# Patient Record
Sex: Male | Born: 1990 | Race: Black or African American | Hispanic: No | Marital: Single | Smoking: Never smoker
Health system: Southern US, Community
[De-identification: ages and names within clinical notes are randomized; demographics above are authoritative.]

## PROBLEM LIST (undated history)

## (undated) DIAGNOSIS — A749 Chlamydial infection, unspecified: Secondary | ICD-10-CM

## (undated) HISTORY — PX: WISDOM TOOTH EXTRACTION: SHX21

---
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
## 2011-08-06 ENCOUNTER — Emergency Department (INDEPENDENT_AMBULATORY_CARE_PROVIDER_SITE_OTHER)
Admission: EM | Admit: 2011-08-06 | Discharge: 2011-08-06 | Disposition: A | Discharge status: Home / Self Care | Payer: Self-pay | Source: Home / Self Care | Attending: Emergency Medicine | Admitting: Emergency Medicine

## 2011-08-06 ENCOUNTER — Encounter (HOSPITAL_COMMUNITY): Payer: Self-pay | Admitting: Emergency Medicine

## 2011-08-06 DIAGNOSIS — N342 Other urethritis: Secondary | ICD-10-CM

## 2011-08-06 HISTORY — DX: Chlamydial infection, unspecified: A74.9

## 2011-08-06 MED ORDER — AZITHROMYCIN 250 MG PO TABS
ORAL_TABLET | ORAL | Status: AC
  Filled 2011-08-06: qty 4

## 2011-08-06 MED ORDER — CEFTRIAXONE SODIUM 250 MG IJ SOLR
250.0000 mg | Freq: Once | INTRAMUSCULAR | Status: AC
  Administered 2011-08-06: 250 mg via INTRAMUSCULAR

## 2011-08-06 MED ORDER — AZITHROMYCIN 250 MG PO TABS
1000.0000 mg | ORAL_TABLET | Freq: Once | ORAL | Status: AC
  Administered 2011-08-06: 1000 mg via ORAL

## 2011-08-06 MED ORDER — CEFTRIAXONE SODIUM 250 MG IJ SOLR
INTRAMUSCULAR | Status: AC
  Filled 2011-08-06: qty 250

## 2011-08-06 MED ORDER — LIDOCAINE HCL (PF) 1 % IJ SOLN
INTRAMUSCULAR | Status: AC
  Filled 2011-08-06: qty 5

## 2011-08-06 NOTE — ED Provider Notes (Signed)
History     CSN: [id number]  Arrival date & time 08/06/11  1554   First MD Initiated Contact with Patient 08/06/11 1654      Chief Complaint  Patient presents with  . SEXUALLY TRANSMITTED DISEASE    (Consider location/radiation/quality/duration/timing/severity/associated sxs/prior treatment) HPI Comments: Pt with 2 days of dysuria.   No urgency, frequency, dysuria, oderous urine, hematuria,  genital blisters, penile itching, pain or discharge. No fevers, N/V, abd pain, back pain. No recent abx use. Pt sexually active with 2 male partners who are both asymptomatic to his knowledge. He does not use condoms with either of them. STD's a concern today. Similar sx before when had Chlamydia. No history of gonorrhea  trichmonoas, syphilis, herpes, HIV.   ROS as noted in HPI. All other ROS negative.    Patient is a 21 y.o. male presenting with STD exposure. The history is provided by the patient.  Exposure to STD This is a new problem. The current episode started more than 2 days ago. The problem occurs constantly. The problem has not changed since onset.Pertinent negatives include no abdominal pain. Nothing aggravates the symptoms. Nothing relieves the symptoms. He has tried a cold compress for the symptoms. The treatment provided no relief.    Past Medical History  Diagnosis Date  . Chlamydia     History reviewed. No pertinent past surgical history.  History reviewed. No pertinent family history.  History  Substance Use Topics  . Smoking status: Never Smoker   . Smokeless tobacco: Not on file  . Alcohol Use: No      Review of Systems  Gastrointestinal: Negative for abdominal pain.    Allergies  Review of patient's allergies indicates no known allergies.  Home Medications  No current outpatient prescriptions on file.  BP 129/57  Pulse 60  Temp 97.7 F (36.5 C) (Oral)  Resp 14  SpO2 100%  Physical Exam  Nursing note and vitals reviewed. Constitutional: He is  oriented to person, place, and time. He appears well-developed and well-nourished.  HENT:  Head: Normocephalic and atraumatic.  Eyes: Conjunctivae and EOM are normal.  Neck: Normal range of motion.  Cardiovascular: Regular rhythm.   Pulmonary/Chest: Effort normal. No respiratory distress.  Abdominal: He exhibits no distension. There is no CVA tenderness.  Genitourinary: Testes normal. Circumcised. No penile erythema or penile tenderness. Discharge found.       Scant watery penile d/c. no rash. Pt declined chaperone.   Musculoskeletal: Normal range of motion. He exhibits no edema and no tenderness.  Lymphadenopathy:       Right: No inguinal adenopathy present.       Left: No inguinal adenopathy present.  Neurological: He is alert and oriented to person, place, and time.  Skin: Skin is warm and dry. No rash noted.  Psychiatric: He has a normal mood and affect. His behavior is normal.    ED Course  Procedures (including critical care time)   Labs Reviewed  GC/CHLAMYDIA PROBE AMP, GENITAL   No results found.   1. Urethritis       MDM   H&P most c/w urethritis. Sent off GC/chlamydia, Will  treat empirically now. Giving ceftriaxone 250 mg IM/azithro 1 gm po.Advised pt to refrain from sexual contact until he knows lab results, symptoms resolve, and partner(s) are treated. Pt provided working phone number. Pt agrees.     Luiz Blare, MD 08/07/11 [phone]

## 2011-08-06 NOTE — ED Notes (Signed)
Patient reports burning sensation for 2-3 days.  Feels this sensation with urination.  Denies discharge, denies sores/blisters.  History of std.

## 2011-08-06 NOTE — ED Notes (Signed)
Given graham crackers and peanut butter to patient, drinking ice water .  Patient aware of delay before being discharged after antibiotic injection

## 2011-08-10 ENCOUNTER — Telehealth (HOSPITAL_COMMUNITY): Payer: Self-pay | Admitting: *Deleted

## 2011-08-10 NOTE — ED Notes (Addendum)
GC neg., Chlamydia pos.  Pt. adequately treated with Zithromax. I called pt. Pt. verified x 2 and given results.  Pt. told he was adequately treated.  Pt. instructed to notify his partner, no sex for 1 week and to practice safe sex. Pt. told he can get HIV testing at the [facility]. STD clinic but needs to schedule an appointment.  DHHS form completed and faxed to the [facility].  Vassie Moselle 08/10/2011

## 2016-04-02 ENCOUNTER — Encounter (HOSPITAL_COMMUNITY): Payer: Self-pay | Admitting: Family Medicine

## 2016-04-02 ENCOUNTER — Ambulatory Visit (HOSPITAL_COMMUNITY)
Admission: EM | Admit: 2016-04-02 | Discharge: 2016-04-02 | Disposition: A | Discharge status: Home / Self Care | Payer: Self-pay | Attending: Internal Medicine | Admitting: Internal Medicine

## 2016-04-02 DIAGNOSIS — K047 Periapical abscess without sinus: Secondary | ICD-10-CM

## 2016-04-02 DIAGNOSIS — K0889 Other specified disorders of teeth and supporting structures: Secondary | ICD-10-CM

## 2016-04-02 MED ORDER — AMOXICILLIN 500 MG PO CAPS: 500.0000 mg | ORAL_CAPSULE | Freq: Three times a day (TID) | ORAL | 0 refills | Status: DC

## 2016-04-02 MED ORDER — NAPROXEN 500 MG PO TABS: 500.0000 mg | ORAL_TABLET | Freq: Two times a day (BID) | ORAL | 0 refills | Status: DC

## 2016-04-02 NOTE — Discharge Instructions (Signed)
°  Patients with Medicaid: [facility] °[address], [phone] °[address], [phone] ° °If unable to pay, or uninsured, contact [facility] ([phone]) or [facility] ([phone] in [place], [phone] in [place]) to become qualified for the adult dental clinic ° °Other Low-Cost Community [facility]: °[facility]- [address], [place], [place], [address] °   [phone], Ext. [phone] °   2nd and 4th Thursday of the month at 6:30am °   10 clients each day by appointment, can sometimes see walk-in patients if someone does not show for an appointment °[facility]- [address], [place], [place], [address] °   [phone] °[facility]- [address], [place], [place], [address] °   [phone] ° °[facility]- [phone] °[facility]- [phone] °[facility]- [phone] ° °

## 2016-04-02 NOTE — ED Triage Notes (Signed)
Pt here for dental pain to left upper area. Facial swelling noted.

## 2016-04-02 NOTE — ED Provider Notes (Signed)
CSN: [id number]     Arrival date & time 04/02/16  1806 History   First MD Initiated Contact with Patient 04/02/16 1840     Chief Complaint  Patient presents with  . Dental Pain   (Consider location/radiation/quality/duration/timing/severity/associated sxs/prior Treatment) HPI  Ronald Rogers is a 26 y.o. male presenting to UC with c/o 2 days of worsening Left upper dental pain with Left side facial swelling and pain.  Pain is aching and throbbing, 8/10 at worst. Worse with eating.  He reports chipping a tooth in same area a few months ago and thinks that is the cause of current symptoms. Denies fever, n/v/d. No difficulty breathing or swallowing.    Past Medical History:  Diagnosis Date  . Chlamydia    History reviewed. No pertinent surgical history. History reviewed. No pertinent family history. Social History  Substance Use Topics  . Smoking status: Never Smoker  . Smokeless tobacco: Never Used  . Alcohol use No    Review of Systems  Constitutional: Negative for chills and fever.  HENT: Positive for dental problem and facial swelling (Left side). Negative for trouble swallowing and voice change.   Gastrointestinal: Negative for diarrhea and vomiting.    Allergies  Patient has no known allergies.  Home Medications   Prior to Admission medications   Medication Sig Start Date End Date Taking? Authorizing Provider  amoxicillin (AMOXIL) 500 MG capsule Take 1 capsule (500 mg total) by mouth 3 (three) times daily. 04/02/16   Junius Finner, PA-C  naproxen (NAPROSYN) 500 MG tablet Take 1 tablet (500 mg total) by mouth 2 (two) times daily. 04/02/16   Junius Finner, PA-C   Meds Ordered and Administered this Visit  Medications - No data to display  BP (!) 152/75 (BP Location: Right Arm) Comment: notified rn  Pulse 62   Temp 98.7 F (37.1 C) (Oral)   Resp 16   SpO2 97%  No data found.   Physical Exam  Constitutional: He is oriented to person, place, and time. He appears  well-developed and well-nourished. No distress.  HENT:  Head: Normocephalic and atraumatic.    Mouth/Throat: Uvula is midline, oropharynx is clear and moist and mucous membranes are normal. No trismus in the jaw. Abnormal dentition. Dental abscesses and dental caries present. No uvula swelling.  Eyes: EOM are normal.  Neck: Normal range of motion.  Cardiovascular: Normal rate.   Pulmonary/Chest: Effort normal.  Musculoskeletal: Normal range of motion.  Neurological: He is alert and oriented to person, place, and time.  Skin: Skin is warm and dry. He is not diaphoretic.  Psychiatric: He has a normal mood and affect. His behavior is normal.  Nursing note and vitals reviewed.   Urgent Care Course     Procedures (including critical care time)  Labs Review Labs Reviewed - No data to display  Imaging Review No results found.    MDM   1. Pain, dental   2. Dental infection    Hx and exam c/w dental abscess.  Rx: Amoxicillin and naproxen  f/u with dentist next week for further evaluation and treatment.    Junius Finner, PA-C 04/02/16 [phone]

## 2020-07-14 DIAGNOSIS — X501XXA Overexertion from prolonged static or awkward postures, initial encounter: Secondary | ICD-10-CM | POA: Insufficient documentation

## 2020-07-14 DIAGNOSIS — Y9367 Activity, basketball: Secondary | ICD-10-CM | POA: Insufficient documentation

## 2020-07-14 DIAGNOSIS — S82141A Displaced bicondylar fracture of right tibia, initial encounter for closed fracture: Secondary | ICD-10-CM | POA: Insufficient documentation

## 2020-07-14 DIAGNOSIS — M25461 Effusion, right knee: Secondary | ICD-10-CM | POA: Insufficient documentation

## 2020-07-15 ENCOUNTER — Emergency Department (HOSPITAL_COMMUNITY)
Admission: EM | Admit: 2020-07-15 | Discharge: 2020-07-15 | Disposition: A | Discharge status: Home / Self Care | Payer: Self-pay | Attending: Emergency Medicine | Admitting: Emergency Medicine

## 2020-07-15 ENCOUNTER — Emergency Department (HOSPITAL_COMMUNITY): Payer: Self-pay

## 2020-07-15 DIAGNOSIS — M25461 Effusion, right knee: Secondary | ICD-10-CM

## 2020-07-15 DIAGNOSIS — S82141A Displaced bicondylar fracture of right tibia, initial encounter for closed fracture: Secondary | ICD-10-CM

## 2020-07-15 MED ORDER — OXYCODONE HCL 5 MG PO TABS: 5.0000 mg | ORAL_TABLET | Freq: Four times a day (QID) | ORAL | 0 refills | Status: AC | PRN

## 2020-07-15 MED ORDER — METHOCARBAMOL 500 MG PO TABS: 500.0000 mg | ORAL_TABLET | Freq: Two times a day (BID) | ORAL | 0 refills | Status: DC

## 2020-07-15 MED ORDER — NAPROXEN 500 MG PO TABS: 500.0000 mg | ORAL_TABLET | Freq: Two times a day (BID) | ORAL | 0 refills | Status: DC

## 2020-07-15 MED ORDER — ONDANSETRON 4 MG PO TBDP
4.0000 mg | ORAL_TABLET | Freq: Once | ORAL | Status: AC
  Administered 2020-07-15: 4 mg via ORAL
  Filled 2020-07-15: qty 1

## 2020-07-15 MED ORDER — NAPROXEN 250 MG PO TABS
500.0000 mg | ORAL_TABLET | Freq: Once | ORAL | Status: AC
  Administered 2020-07-15: 500 mg via ORAL
  Filled 2020-07-15: qty 2

## 2020-07-15 MED ORDER — OXYCODONE-ACETAMINOPHEN 5-325 MG PO TABS
1.0000 | ORAL_TABLET | Freq: Once | ORAL | Status: AC
  Administered 2020-07-15: 1 via ORAL
  Filled 2020-07-15: qty 1

## 2020-07-15 MED ORDER — LIDOCAINE 5 % EX PTCH: 1.0000 | MEDICATED_PATCH | CUTANEOUS | 0 refills | Status: DC

## 2020-07-15 NOTE — Discharge Instructions (Addendum)
Knee immobilizer on, use crutches.  I have prescribed you some pain medicine.  Do not drive or operate heavy machinery while taking this medication.  Follow-up with orthopedics.  You will need to call the number listed on discharge paperwork to schedule an appointment

## 2020-07-15 NOTE — Progress Notes (Signed)
Orthopedic Tech Progress Note Patient Details:  Ronald Rogers [birth year] [id number]  Ortho Devices Type of Ortho Device: Crutches, Knee Immobilizer Ortho Device/Splint Location: RLE Ortho Device/Splint Interventions: Ordered, Adjustment, Application   Post Interventions Patient Tolerated: Ambulated well, Well Instructions Provided: Poper ambulation with device, Care of device  [name] 07/15/2020, 11:54 AM

## 2020-07-15 NOTE — ED Notes (Signed)
Patient refused vitals.

## 2020-07-15 NOTE — ED Notes (Signed)
Pt verbalized understanding of discharge paperwork and follow-up care.  °

## 2020-07-15 NOTE — ED Provider Notes (Signed)
Emergency Medicine Provider Triage Evaluation Note  Ronald Rogers , a 30 y.o. male  was evaluated in triage.  Pt complains of R knee pain. Onset at 1930 while playing basketball. Came down on his R leg wrong. Has been having constant pain aggravated by ambulation. Started swelling a few hours later. No medications PTA. No loss of sensation. No prior knee trauma.  Review of Systems  Positive: R knee pain Negative: Numbness   Physical Exam  BP 127/78 (BP Location: Left Arm)   Pulse 78   Temp 98.4 F (36.9 C) (Oral)   Resp 16   SpO2 100%  Gen:   Awake, no distress   Resp:  Normal effort  MSK:   Moves extremities without difficulty  Other:  DP pulse 2+ in the RLE. Swelling of the R knee. No crepitus.  Medical Decision Making  Medically screening exam initiated at 1:39 AM.  Appropriate orders placed.  OTTAVIO NOREM was informed that the remainder of the evaluation will be completed by another provider, this initial triage assessment does not replace that evaluation, and the importance of remaining in the ED until their evaluation is complete.  R knee pain   Antony Madura, PA-C 07/15/20 0141    Shon Baton, MD 07/15/20 [phone]

## 2020-07-15 NOTE — ED Triage Notes (Signed)
Pt c/o R knee/swelling, playing basketball approx 1930, came down wrong on R leg, swelling & pain after. 10/10 "worst pain I've ever been in in my life"

## 2020-07-15 NOTE — ED Provider Notes (Signed)
[facility] EMERGENCY DEPARTMENT Provider Note   CSN: [id number] Arrival date & time: 07/14/20  2228    History Chief Complaint  Patient presents with  . Knee Pain    R    Ronald Rogers is a 30 y.o. male with no significant past medical history who presents for evaluation of right knee pain. Was playing basketball  and "landed wrong and twisted. Paint to lateral aspect right knee. Associated swelling, Pain with ROM. Has been unable to bare weight secondary to pain. No overlying redness, warmth. No fever, chills, emesis, numbness, weakness. Denies additional aggravating or alleviating factors. Rates pain a 9/10.  History obtained from patient and past medical records. No interpretor was used.  HPI     Past Medical History:  Diagnosis Date  . Chlamydia     There are no problems to display for this patient.   No past surgical history on file.     No family history on file.  Social History   Tobacco Use  . Smoking status: Never  . Smokeless tobacco: Never  Substance Use Topics  . Alcohol use: No  . Drug use: No    Home Medications Prior to Admission medications   Medication Sig Start Date End Date Taking? Authorizing Provider  amoxicillin (AMOXIL) 500 MG capsule Take 1 capsule (500 mg total) by mouth 3 (three) times daily. 04/02/16   Lurene Shadow, PA-C  lidocaine (LIDODERM) 5 % Place 1 patch onto the skin daily. Remove & Discard patch within 12 hours or as directed by MD 07/15/20  Yes Shakeisha Horine A, PA-C  methocarbamol (ROBAXIN) 500 MG tablet Take 1 tablet (500 mg total) by mouth 2 (two) times daily. 07/15/20  Yes Babyboy Loya A, PA-C  naproxen (NAPROSYN) 500 MG tablet Take 1 tablet (500 mg total) by mouth 2 (two) times daily. 07/15/20  Yes Vollie Brunty A, PA-C  oxyCODONE (ROXICODONE) 5 MG immediate release tablet Take 1 tablet (5 mg total) by mouth every 6 (six) hours as needed for up to 3 days for severe pain. 07/15/20 07/18/20 Yes  Monesha Monreal A, PA-C    Allergies    Patient has no known allergies.  Review of Systems   Review of Systems  Constitutional: Negative.   HENT: Negative.    Respiratory: Negative.    Cardiovascular: Negative.   Gastrointestinal: Negative.   Genitourinary: Negative.   Musculoskeletal:  Positive for gait problem. Negative for neck pain and neck stiffness.       Right knee pain  Skin: Negative.   All other systems reviewed and are negative.  Physical Exam Updated Vital Signs BP 131/87   Pulse 74   Temp 98.5 F (36.9 C) (Oral)   Resp 16   Ht 6\' 3"  (1.905 m)   SpO2 98%   Physical Exam Vitals and nursing note reviewed.  Constitutional:      General: He is not in acute distress.    Appearance: He is well-developed. He is not ill-appearing, toxic-appearing or diaphoretic.  HENT:     Head: Normocephalic and atraumatic.     Nose: Nose normal.     Mouth/Throat:     Mouth: Mucous membranes are moist.  Eyes:     Pupils: Pupils are equal, round, and reactive to light.  Cardiovascular:     Rate and Rhythm: Normal rate and regular rhythm.     Pulses: Normal pulses.          Dorsalis pedis pulses are 2+ on  the right side and 2+ on the left side.     Heart sounds: Normal heart sounds.  Pulmonary:     Effort: Pulmonary effort is normal. No respiratory distress.  Abdominal:     General: There is no distension.     Palpations: Abdomen is soft.  Musculoskeletal:        General: Normal range of motion.     Cervical back: Normal range of motion and neck supple.     Comments: Diffuse tenderness to lateral aspect right tibial plateau. Small joint effusion. Non tender to mid shaft tib/fib, ankle, foot, hamstring. Able to straight leg raise wo difficulty. Pain with Vargus stress.  Skin:    General: Skin is warm and dry.     Capillary Refill: Capillary refill takes less than 2 seconds.     Comments: Overlying right knee swelling, without erythema, warmth, rash  Neurological:      General: No focal deficit present.     Mental Status: He is alert and oriented to person, place, and time.     Comments: Intact sensation, decreased strength to right LE likely due to pain.   ED Results / Procedures / Treatments   Labs (all labs ordered are listed, but only abnormal results are displayed) Labs Reviewed - No data to display  EKG None  Radiology CT Knee Right Wo Contrast  Result Date: 07/15/2020 CLINICAL DATA:  Right knee pain after hyperextension injury playing basketball 1 day ago EXAM: CT OF THE RIGHT KNEE WITHOUT CONTRAST TECHNIQUE: Multidetector CT imaging of the right knee was performed according to the standard protocol. Multiplanar CT image reconstructions were also generated. COMPARISON:  X-ray 07/15/2020 FINDINGS: Bones/Joint/Cartilage Small minimally displaced avulsion fracture fragment from the peripheral aspect of the lateral tibial plateau suggestive of a Segond fracture (series 7, image 82). Additional nondisplaced impaction-type fracture of the posterior aspect of the lateral tibial plateau (series 9, images 48-56). No fracture is seen involving the lateral femoral condyle. Remaining osseous structures are intact. No additional fractures. Patella is anteriorly lifted from the trochlea secondary to a large space-occupying hemarthrosis. 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 No suspicious bone lesions. Ligaments Suspected ACL rupture. Prominent MCL periligamentous edema. Ligamentous structures are suboptimally evaluated by CT. Muscles and Tendons No acute musculotendinous injury is evident. Extensor mechanism appears grossly intact. Soft tissues Soft tissue swelling and ill-defined fluid most pronounced at the anterior aspect of the knee. No organized fluid collection or hematoma. IMPRESSION: 1. Small minimally displaced avulsion  fracture fragment from the peripheral aspect of the lateral tibial plateau suggestive of a Segond fracture. 2. Additional nondisplaced impaction-type fracture of the posterior aspect of the lateral tibial plateau, likely sequela of pivot-shift bone injury. 3. Large knee joint hemarthrosis. 4. Suspected ACL rupture 5. Prominent MCL periligamentous edema suggests a at least a grade 1 MCL sprain. Electronically Signed   By: Duanne GuessNicholas  Plundo D.O.   On: 07/15/2020 11:17   DG Knee Complete 4 Views Right  Result Date: 07/15/2020 CLINICAL DATA:  Knee pain and swelling following playing basketball, initial encounter EXAM: RIGHT KNEE - COMPLETE 4+ VIEW COMPARISON:  None. FINDINGS: Small joint effusion is noted. There is an avulsion fracture from the lateral aspect of the proximal tibia. No other fractures are seen. IMPRESSION: Avulsion from the lateral aspect of the proximal tibia. Associated joint effusion is seen. No other focal abnormality is noted.  Electronically Signed   By: Alcide Clever M.D.   On: 07/15/2020 02:10    Procedures .Splint Application  Date/Time: 07/15/2020 12:18 PM Performed by: Ralph Leyden A, PA-C Authorized by: Linwood Dibbles, PA-C   Consent:    Consent obtained:  Verbal   Consent given by:  Patient   Risks, benefits, and alternatives were discussed: yes     Risks discussed:  Discoloration, numbness, pain and swelling   Alternatives discussed:  No treatment, delayed treatment, alternative treatment, observation and referral Universal protocol:    Procedure explained and questions answered to patient or proxy's satisfaction: yes     Relevant documents present and verified: yes     Test results available: yes     Imaging studies available: yes     Required blood products, implants, devices, and special equipment available: yes     Site/side marked: yes     Immediately prior to procedure a time out was called: yes     Patient identity confirmed:  Verbally with  patient Pre-procedure details:    Distal neurologic exam:  Normal   Distal perfusion: distal pulses strong and brisk capillary refill   Procedure details:    Location:  Knee   Knee location:  R knee   Strapping: no     Cast type:  Short leg walking   Splint type:  Knee immobilizer   Attestation: Splint applied and adjusted personally by me   Post-procedure details:    Distal neurologic exam:  Normal   Distal perfusion: distal pulses strong and brisk capillary refill     Procedure completion:  Tolerated well, no immediate complications   Post-procedure imaging: not applicable     Medications Ordered in ED Medications  naproxen (NAPROSYN) tablet 500 mg (500 mg Oral Given 07/15/20 0147)  oxyCODONE-acetaminophen (PERCOCET/ROXICET) 5-325 MG per tablet 1 tablet (1 tablet Oral Given 07/15/20 1025)  ondansetron (ZOFRAN-ODT) disintegrating tablet 4 mg (4 mg Oral Given 07/15/20 1024)   ED Course  I have reviewed the triage vital signs and the nursing notes.  Pertinent labs & imaging results that were available during my care of the patient were reviewed by me and considered in my medical decision making (see chart for details).  Here for evaluation of mechanical fall with subsequent right knee twisting. Afebrile, non septic, non ill appearing. Small joint effusion however no overlying redness, warmth. Non bony tenderness to distal tib/fib, ankle or to hamstring. Able to straight leg raise bilaterally. Compartments soft. NV intact. Low suspicion for tibial dislocation. Xray obtained from triage which I personally reviewed and interpreted showed right tibial avulsion fracture. Plan on CT knee, pain management likely knee immobilizer crutches and FU outpatient with ortho.   CT knee shows hemarthrosis, likely ACL, MCL tear as well as tibial plateau fracture.  Placed in knee immobilizer, crutches. Will FU with Ortho   The patient has been appropriately medically screened and/or stabilized in the ED.  I have low suspicion for any other emergent medical condition which would require further screening, evaluation or treatment in the ED or require inpatient management.  Patient is hemodynamically stable and in no acute distress.  Patient able to ambulate in department prior to ED.  Evaluation does not show acute pathology that would require ongoing or additional emergent interventions while in the emergency department or further inpatient treatment.  I have discussed the diagnosis with the patient and answered all questions.  Pain is been managed while in the emergency department and patient has  no further complaints prior to discharge.  Patient is comfortable with plan discussed in room and is stable for discharge at this time.  I have discussed strict return precautions for returning to the emergency department.  Patient was encouraged to follow-up with PCP/specialist refer to at discharge.     MDM Rules/Calculators/A&P                           Final Clinical Impression(s) / ED Diagnoses Final diagnoses:  Closed fracture of right tibial plateau, initial encounter  Effusion of bursa of right knee    Rx / DC Orders ED Discharge Orders          Ordered    oxyCODONE (ROXICODONE) 5 MG immediate release tablet  Every 6 hours PRN        07/15/20 1217    lidocaine (LIDODERM) 5 %  Every 24 hours        07/15/20 1217    methocarbamol (ROBAXIN) 500 MG tablet  2 times daily        07/15/20 1217    naproxen (NAPROSYN) 500 MG tablet  2 times daily        07/15/20 1217             Veto Macqueen A, PA-C 07/15/20 1219    Cathren Laine, MD 07/16/20 [phone]

## 2020-07-17 ENCOUNTER — Other Ambulatory Visit: Payer: Self-pay

## 2020-07-17 ENCOUNTER — Ambulatory Visit: Payer: Self-pay | Admitting: Orthopaedic Surgery

## 2020-07-17 ENCOUNTER — Encounter: Payer: Self-pay | Admitting: Surgery

## 2020-07-17 VITALS — Ht 75.0 in

## 2020-07-17 DIAGNOSIS — G8929 Other chronic pain: Secondary | ICD-10-CM

## 2020-07-17 DIAGNOSIS — M25561 Pain in right knee: Secondary | ICD-10-CM

## 2020-07-17 NOTE — Progress Notes (Signed)
   Office Visit Note   Patient: Ronald Rogers           Date of Birth: [birth year]           MRN: [id number] Visit Date: 07/17/2020              Requested by: No referring provider defined for this encounter. PCP: Patient, No Pcp Per (Inactive)   Assessment & Plan: Visit Diagnoses:  1. Chronic pain of right knee     Plan: Impression is right knee ACL tear.  We have discussed obtaining an MRI of the right knee to further assess the ACL.  We have also discussed the importance of wearing the knee immobilizer for now due to the knee instability.  We will follow-up with [place] once the MRIs been completed.  Call with concerns or questions in the meantime.  Follow-Up Instructions: No follow-ups on file.   Orders:  No orders of the defined types were placed in this encounter.  No orders of the defined types were placed in this encounter.     Procedures: No procedures performed   Clinical Data: No additional findings.   Subjective: Chief Complaint  Patient presents with   Right Knee - Fracture    HPI patient is a pleasant 31 year old gentleman who comes in today following an injury to his right knee.  This past Tuesday, 07/15/2020, he was playing basketball when he jumped up and came down causing his knee to buckle.  He was seen in the ED where x-rays were obtained.  X-ray and CT scan of the right knee obtained which showed a Segond fracture.  He was placed in a knee immobilizer and comes in today for further evaluation treatment recommendation.  The pain he has is primarily to the lateral knee.  He has associated swelling and instability.  He has not been wearing his knee immobilizer due to discomfort.  He has been taking oxycodone and naproxen without significant relief.  Review of Systems as detailed in HPI.  All others reviewed and are negative.   Objective: Vital Signs: Ht 6\' 3"  (1.905 m)   Physical Exam well-developed well-nourished gentleman in no acute distress.  Alert and  oriented x3.  Ortho Exam right knee exam shows a moderate sized effusion.  Range of motion 0 to 95 degrees.  Lateral joint line tenderness.  Positive anterior drawer.  He is stable to valgus and varus stress.  He is neurovascular intact distally.  Specialty Comments:  No specialty comments available.  Imaging: No new imaging   PMFS History: There are no problems to display for this patient.  Past Medical History:  Diagnosis Date   Chlamydia     History reviewed. No pertinent family history.  History reviewed. No pertinent surgical history. Social History   Occupational History   Not on file  Tobacco Use   Smoking status: Never   Smokeless tobacco: Never  Substance and Sexual Activity   Alcohol use: No   Drug use: No   Sexual activity: Yes    Birth control/protection: None    Comment: mul[employer] partners currently reports 2

## 2020-07-21 ENCOUNTER — Ambulatory Visit: Payer: Self-pay | Admitting: Family Medicine

## 2020-07-21 ENCOUNTER — Other Ambulatory Visit: Payer: Self-pay

## 2020-07-21 DIAGNOSIS — M25561 Pain in right knee: Secondary | ICD-10-CM

## 2020-07-21 DIAGNOSIS — G8929 Other chronic pain: Secondary | ICD-10-CM

## 2020-08-10 ENCOUNTER — Ambulatory Visit
Admission: RE | Admit: 2020-08-10 | Discharge: 2020-08-10 | Disposition: A | Discharge status: Home / Self Care | Payer: Medicaid Other | Source: Ambulatory Visit | Attending: Orthopaedic Surgery | Admitting: Orthopaedic Surgery

## 2020-08-10 DIAGNOSIS — G8929 Other chronic pain: Secondary | ICD-10-CM

## 2020-08-11 NOTE — Progress Notes (Signed)
Needs appt

## 2020-08-11 NOTE — Progress Notes (Signed)
Appt made

## 2020-08-26 ENCOUNTER — Ambulatory Visit (INDEPENDENT_AMBULATORY_CARE_PROVIDER_SITE_OTHER): Payer: Self-pay | Admitting: Orthopaedic Surgery

## 2020-08-26 ENCOUNTER — Other Ambulatory Visit: Payer: Self-pay

## 2020-08-26 ENCOUNTER — Encounter: Payer: Self-pay | Admitting: Orthopaedic Surgery

## 2020-08-26 DIAGNOSIS — S83241A Other tear of medial meniscus, current injury, right knee, initial encounter: Secondary | ICD-10-CM

## 2020-08-26 DIAGNOSIS — S83281A Other tear of lateral meniscus, current injury, right knee, initial encounter: Secondary | ICD-10-CM

## 2020-08-26 DIAGNOSIS — S83511A Sprain of anterior cruciate ligament of right knee, initial encounter: Secondary | ICD-10-CM

## 2020-08-26 NOTE — Progress Notes (Signed)
   Office Visit Note   Patient: Ronald Rogers           Date of Birth: [birth year]           MRN: [id number] Visit Date: 08/26/2020              Requested by: No referring provider defined for this encounter. PCP: Patient, No Pcp Per (Inactive)   Assessment & Plan: Visit Diagnoses:  1. Complete tear of right ACL, initial encounter   2. Acute medial meniscus tear of right knee, initial encounter   3. Acute lateral meniscus tear of right knee, initial encounter     Plan: MRI of the right knee shows a complete tear of the ACL as well as medial and lateral meniscus tears.  He has partial-thickness cartilage loss of the lateral femoral condyle.  These findings were reviewed with the patient in detail and based on these findings I have recommended arthroscopic ACL reconstruction with quadriceps autograft.  We will attempt to repair the meniscal tears as well but he understands that these may not be repairable and will then be subjected to a partial meniscectomy.  Questions encouraged and answered.  Risk benefits rehab recovery alternatives of surgery all reviewed with the patient in detail.  Patient will be set up with a CPM postoperatively as well.  Follow-Up Instructions: No follow-ups on file.   Orders:  No orders of the defined types were placed in this encounter.  No orders of the defined types were placed in this encounter.     Procedures: No procedures performed   Clinical Data: No additional findings.   Subjective: Chief Complaint  Patient presents with   Right Knee - Follow-up    MRI review    Ronald Rogers returns today to follow-up for his recent right knee MRI with suspicion of ACL injury.  Overall his range of motion has returned back to normal.  He does feel continued instability with walking and daily activities.  Currently wearing a knee immobilizer to help with this.   Review of Systems   Objective: Vital Signs: There were no vitals taken for this  visit.  Physical Exam  Ortho Exam Right knee shows small residual effusion.  Positive Lachman and pivot shift.  Anterior drawer of about 2 mm with solid endpoint.  Medial and lateral joint line tenderness.  He has regained most of his range of motion at this point and there is no pain with passive range of motion.  Specialty Comments:  No specialty comments available.  Imaging: No results found.   PMFS History: There are no problems to display for this patient.  Past Medical History:  Diagnosis Date   Chlamydia     History reviewed. No pertinent family history.  History reviewed. No pertinent surgical history. Social History   Occupational History   Not on file  Tobacco Use   Smoking status: Never   Smokeless tobacco: Never  Substance and Sexual Activity   Alcohol use: No   Drug use: No   Sexual activity: Yes    Birth control/protection: None    Comment: mul[employer] partners currently reports 2

## 2020-09-10 ENCOUNTER — Encounter (HOSPITAL_BASED_OUTPATIENT_CLINIC_OR_DEPARTMENT_OTHER): Payer: Self-pay | Admitting: Orthopaedic Surgery

## 2020-09-10 ENCOUNTER — Other Ambulatory Visit: Payer: Self-pay

## 2020-09-17 ENCOUNTER — Encounter (HOSPITAL_BASED_OUTPATIENT_CLINIC_OR_DEPARTMENT_OTHER): Payer: Self-pay | Admitting: Orthopaedic Surgery

## 2020-09-17 ENCOUNTER — Other Ambulatory Visit: Payer: Self-pay

## 2020-09-17 ENCOUNTER — Ambulatory Visit (HOSPITAL_BASED_OUTPATIENT_CLINIC_OR_DEPARTMENT_OTHER): Payer: Self-pay | Admitting: Anesthesiology

## 2020-09-17 ENCOUNTER — Encounter: Payer: Self-pay | Admitting: Orthopaedic Surgery

## 2020-09-17 ENCOUNTER — Encounter (HOSPITAL_BASED_OUTPATIENT_CLINIC_OR_DEPARTMENT_OTHER)
Admission: RE | Disposition: A | Discharge status: Home / Self Care | Payer: Self-pay | Source: Home / Self Care | Attending: Orthopaedic Surgery

## 2020-09-17 ENCOUNTER — Ambulatory Visit (HOSPITAL_BASED_OUTPATIENT_CLINIC_OR_DEPARTMENT_OTHER)
Admission: RE | Admit: 2020-09-17 | Discharge: 2020-09-17 | Disposition: A | Discharge status: Home / Self Care | Payer: Self-pay | Attending: Orthopaedic Surgery | Admitting: Orthopaedic Surgery

## 2020-09-17 DIAGNOSIS — S83511A Sprain of anterior cruciate ligament of right knee, initial encounter: Secondary | ICD-10-CM

## 2020-09-17 DIAGNOSIS — S83281A Other tear of lateral meniscus, current injury, right knee, initial encounter: Secondary | ICD-10-CM | POA: Insufficient documentation

## 2020-09-17 DIAGNOSIS — S83282A Other tear of lateral meniscus, current injury, left knee, initial encounter: Secondary | ICD-10-CM

## 2020-09-17 DIAGNOSIS — F17210 Nicotine dependence, cigarettes, uncomplicated: Secondary | ICD-10-CM | POA: Insufficient documentation

## 2020-09-17 DIAGNOSIS — S83241A Other tear of medial meniscus, current injury, right knee, initial encounter: Secondary | ICD-10-CM

## 2020-09-17 DIAGNOSIS — Z79899 Other long term (current) drug therapy: Secondary | ICD-10-CM | POA: Insufficient documentation

## 2020-09-17 DIAGNOSIS — X58XXXA Exposure to other specified factors, initial encounter: Secondary | ICD-10-CM | POA: Insufficient documentation

## 2020-09-17 DIAGNOSIS — S83211A Bucket-handle tear of medial meniscus, current injury, right knee, initial encounter: Secondary | ICD-10-CM | POA: Insufficient documentation

## 2020-09-17 HISTORY — PX: ANTERIOR CRUCIATE LIGAMENT REPAIR: SHX115

## 2020-09-17 HISTORY — PX: KNEE ARTHROSCOPY WITH MENISCAL REPAIR: SHX5653

## 2020-09-17 SURGERY — ARTHROSCOPY, KNEE, WITH MENISCUS REPAIR
Anesthesia: General | Site: Knee | Laterality: Right

## 2020-09-17 MED ORDER — CEFAZOLIN SODIUM-DEXTROSE 2-4 GM/100ML-% IV SOLN
2.0000 g | INTRAVENOUS | Status: AC
  Administered 2020-09-17: 2 g via INTRAVENOUS

## 2020-09-17 MED ORDER — MIDAZOLAM HCL 2 MG/2ML IJ SOLN
INTRAMUSCULAR | Status: AC
  Filled 2020-09-17: qty 2

## 2020-09-17 MED ORDER — FENTANYL CITRATE (PF) 100 MCG/2ML IJ SOLN: 25.0000 ug | INTRAMUSCULAR | Status: DC | PRN

## 2020-09-17 MED ORDER — FENTANYL CITRATE (PF) 100 MCG/2ML IJ SOLN
INTRAMUSCULAR | Status: AC
  Filled 2020-09-17: qty 2

## 2020-09-17 MED ORDER — OXYCODONE-ACETAMINOPHEN 5-325 MG PO TABS: 1.0000 | ORAL_TABLET | Freq: Three times a day (TID) | ORAL | 0 refills | Status: DC | PRN

## 2020-09-17 MED ORDER — DEXMEDETOMIDINE (PRECEDEX) IN NS 20 MCG/5ML (4 MCG/ML) IV SYRINGE
PREFILLED_SYRINGE | INTRAVENOUS | Status: DC | PRN
  Administered 2020-09-17: 8 ug via INTRAVENOUS
  Administered 2020-09-17: 12 ug via INTRAVENOUS

## 2020-09-17 MED ORDER — LACTATED RINGERS IV SOLN: INTRAVENOUS | Status: DC

## 2020-09-17 MED ORDER — PROPOFOL 500 MG/50ML IV EMUL
INTRAVENOUS | Status: DC | PRN
  Administered 2020-09-17: 150 ug/kg/min via INTRAVENOUS

## 2020-09-17 MED ORDER — MIDAZOLAM HCL 2 MG/2ML IJ SOLN
2.0000 mg | Freq: Once | INTRAMUSCULAR | Status: AC
  Administered 2020-09-17: 2 mg via INTRAVENOUS

## 2020-09-17 MED ORDER — OXYCODONE HCL 5 MG PO TABS: 5.0000 mg | ORAL_TABLET | Freq: Once | ORAL | Status: DC | PRN

## 2020-09-17 MED ORDER — PROMETHAZINE HCL 25 MG PO TABS: 25.0000 mg | ORAL_TABLET | Freq: Four times a day (QID) | ORAL | 1 refills | Status: DC | PRN

## 2020-09-17 MED ORDER — DEXAMETHASONE SODIUM PHOSPHATE 10 MG/ML IJ SOLN
INTRAMUSCULAR | Status: DC | PRN
  Administered 2020-09-17: 10 mg via INTRAVENOUS

## 2020-09-17 MED ORDER — BUPIVACAINE-EPINEPHRINE (PF) 0.5% -1:200000 IJ SOLN
INTRAMUSCULAR | Status: DC | PRN
  Administered 2020-09-17: 30 mL via PERINEURAL

## 2020-09-17 MED ORDER — CEFAZOLIN SODIUM-DEXTROSE 2-4 GM/100ML-% IV SOLN
INTRAVENOUS | Status: AC
  Filled 2020-09-17: qty 100

## 2020-09-17 MED ORDER — SODIUM CHLORIDE 0.9 % IR SOLN
Status: DC | PRN
  Administered 2020-09-17: 18000 mL

## 2020-09-17 MED ORDER — LIDOCAINE HCL (CARDIAC) PF 100 MG/5ML IV SOSY
PREFILLED_SYRINGE | INTRAVENOUS | Status: DC | PRN
  Administered 2020-09-17: 60 mg via INTRAVENOUS

## 2020-09-17 MED ORDER — METHOCARBAMOL 750 MG PO TABS: 750.0000 mg | ORAL_TABLET | Freq: Two times a day (BID) | ORAL | 3 refills | Status: DC | PRN

## 2020-09-17 MED ORDER — FENTANYL CITRATE (PF) 100 MCG/2ML IJ SOLN
INTRAMUSCULAR | Status: DC | PRN
  Administered 2020-09-17 (×10): 50 ug via INTRAVENOUS

## 2020-09-17 MED ORDER — ONDANSETRON HCL 4 MG/2ML IJ SOLN
INTRAMUSCULAR | Status: AC
  Filled 2020-09-17: qty 2

## 2020-09-17 MED ORDER — LIDOCAINE 2% (20 MG/ML) 5 ML SYRINGE
INTRAMUSCULAR | Status: AC
  Filled 2020-09-17: qty 5

## 2020-09-17 MED ORDER — OXYCODONE HCL 5 MG/5ML PO SOLN: 5.0000 mg | Freq: Once | ORAL | Status: DC | PRN

## 2020-09-17 MED ORDER — FENTANYL CITRATE (PF) 100 MCG/2ML IJ SOLN
100.0000 ug | Freq: Once | INTRAMUSCULAR | Status: AC
  Administered 2020-09-17: 100 ug via INTRAVENOUS

## 2020-09-17 MED ORDER — DEXAMETHASONE SODIUM PHOSPHATE 10 MG/ML IJ SOLN
INTRAMUSCULAR | Status: AC
  Filled 2020-09-17: qty 1

## 2020-09-17 MED ORDER — PROPOFOL 10 MG/ML IV BOLUS
INTRAVENOUS | Status: DC | PRN
  Administered 2020-09-17: 50 mg via INTRAVENOUS
  Administered 2020-09-17: 250 mg via INTRAVENOUS
  Administered 2020-09-17: 100 mg via INTRAVENOUS

## 2020-09-17 MED ORDER — ONDANSETRON HCL 4 MG/2ML IJ SOLN
INTRAMUSCULAR | Status: DC | PRN
  Administered 2020-09-17: 4 mg via INTRAVENOUS

## 2020-09-17 MED ORDER — ONDANSETRON HCL 4 MG/2ML IJ SOLN: 4.0000 mg | Freq: Four times a day (QID) | INTRAMUSCULAR | Status: DC | PRN

## 2020-09-17 MED ORDER — PROPOFOL 500 MG/50ML IV EMUL
INTRAVENOUS | Status: AC
  Filled 2020-09-17: qty 50

## 2020-09-17 SURGICAL SUPPLY — 96 items
BANDAGE ESMARK 6X9 LF (GAUZE/BANDAGES/DRESSINGS) ×1 IMPLANT
BLADE EXCALIBUR 4.0X13 (MISCELLANEOUS) ×2 IMPLANT
BLADE SURG 15 STRL LF DISP TIS (BLADE) ×1 IMPLANT
BLADE SURG 15 STRL SS (BLADE) ×2
BNDG ELASTIC 6X5.8 VLCR STR LF (GAUZE/BANDAGES/DRESSINGS) ×2 IMPLANT
BNDG ESMARK 6X9 LF (GAUZE/BANDAGES/DRESSINGS) ×2
BURR OVAL 8 FLU 4.0X13 (MISCELLANEOUS) IMPLANT
COOLER ICEMAN CLASSIC (MISCELLANEOUS) ×2 IMPLANT
COVER BACK TABLE 60X90IN (DRAPES) ×2 IMPLANT
CUFF TOURN SGL QUICK 34 (TOURNIQUET CUFF)
CUFF TRNQT CYL 34X4.125X (TOURNIQUET CUFF) IMPLANT
CUTTER SUT JUGGER STITCH CU (CUTTER) ×1 IMPLANT
CUTTER TENSIONER SUT 2-0 0 FBW (INSTRUMENTS) IMPLANT
DECANTER SPIKE VIAL GLASS SM (MISCELLANEOUS) IMPLANT
DISSECTOR  3.8MM X 13CM (MISCELLANEOUS) ×2
DISSECTOR 3.8MM X 13CM (MISCELLANEOUS) IMPLANT
DRAPE ARTHROSCOPY W/POUCH 90 (DRAPES) ×2 IMPLANT
DRAPE IMP U-DRAPE 54X76 (DRAPES) ×3 IMPLANT
DRAPE OEC MINIVIEW 54X84 (DRAPES) ×2 IMPLANT
DRAPE U-SHAPE 47X51 STRL (DRAPES) ×2 IMPLANT
DRILL FLIPCUTTER III 6-12 (ORTHOPEDIC DISPOSABLE SUPPLIES) ×1 IMPLANT
DRSG EMULSION OIL 3X3 NADH (GAUZE/BANDAGES/DRESSINGS) ×2 IMPLANT
DRSG PAD ABDOMINAL 8X10 ST (GAUZE/BANDAGES/DRESSINGS) ×2 IMPLANT
DURAPREP 26ML APPLICATOR (WOUND CARE) ×5 IMPLANT
ELECT REM PT RETURN 9FT ADLT (ELECTROSURGICAL) ×2
ELECTRODE REM PT RTRN 9FT ADLT (ELECTROSURGICAL) ×1 IMPLANT
FIBERSTICK 2 (SUTURE) ×3 IMPLANT
FLIPCUTTER III 6-12 AR-1204FF (ORTHOPEDIC DISPOSABLE SUPPLIES) ×2
GAUZE SPONGE 4X4 12PLY STRL (GAUZE/BANDAGES/DRESSINGS) ×2 IMPLANT
GLOVE SURG ENC MOIS LTX SZ6.5 (GLOVE) ×1 IMPLANT
GLOVE SURG ENC MOIS LTX SZ7 (GLOVE) ×1 IMPLANT
GLOVE SURG POLYISO LF SZ6 (GLOVE) ×1 IMPLANT
GLOVE SURG POLYISO LF SZ7 (GLOVE) ×1 IMPLANT
GLOVE SURG SYN 7.5  E (GLOVE) ×4
GLOVE SURG SYN 7.5 E (GLOVE) ×2 IMPLANT
GLOVE SURG SYN 7.5 PF PI (GLOVE) ×2 IMPLANT
GLOVE SURG UNDER POLY LF SZ6 (GLOVE) ×1 IMPLANT
GLOVE SURG UNDER POLY LF SZ7 (GLOVE) ×5 IMPLANT
GLOVE SURG UNDER POLY LF SZ7.5 (GLOVE) ×2 IMPLANT
GOWN STRL REIN XL XLG (GOWN DISPOSABLE) ×4 IMPLANT
GOWN STRL REUS W/ TWL LRG LVL3 (GOWN DISPOSABLE) ×1 IMPLANT
GOWN STRL REUS W/TWL LRG LVL3 (GOWN DISPOSABLE) ×6
IMMOBILIZER KNEE 22 UNIV (SOFTGOODS) IMPLANT
IMMOBILIZER KNEE 24 THIGH 36 (MISCELLANEOUS) IMPLANT
IMMOBILIZER KNEE 24 UNIV (MISCELLANEOUS) ×2
IMP SYS 2ND FIX PEEK 4.75X19.1 (Miscellaneous) ×2 IMPLANT
IMPL SYS 2ND FX PEEK 4.75X19.1 (Miscellaneous) IMPLANT
IMPL TIGHTROP ABS ACL FIBERTG (Orthopedic Implant) IMPLANT
IMPL TIGHTROP FIBERTAG ACL (Orthopedic Implant) IMPLANT
IMPL TIGHTROPE ABS ACL FIBERTG (Orthopedic Implant) ×2 IMPLANT
IMPLANT TIGHTROPE FIBERTAG ACL (Orthopedic Implant) ×2 IMPLANT
JUGGERSTITCH IMPLANT CVD (Miscellaneous) ×1 IMPLANT
JUGGERSTITCH SLED CANNULA (MISCELLANEOUS) ×1 IMPLANT
KIT BUTTON TIGHTROPE ABS 8X12 (Anchor) ×1 IMPLANT
KIT TRANSTIBIAL (DISPOSABLE) IMPLANT
KNIFE GRAFT ACL 9MM (MISCELLANEOUS) ×1 IMPLANT
LOOP 2 FIBERLINK CLOSED (SUTURE) IMPLANT
MANIFOLD NEPTUNE II (INSTRUMENTS) ×4 IMPLANT
NS IRRIG 1000ML POUR BTL (IV SOLUTION) ×2 IMPLANT
PACK ARTHROSCOPY DSU (CUSTOM PROCEDURE TRAY) ×2 IMPLANT
PACK BASIN DAY SURGERY FS (CUSTOM PROCEDURE TRAY) ×2 IMPLANT
PAD CAST 4YDX4 CTTN HI CHSV (CAST SUPPLIES) ×1 IMPLANT
PAD COLD SHLDR WRAP-ON (PAD) ×2 IMPLANT
PADDING CAST COTTON 4X4 STRL (CAST SUPPLIES) ×2
PADDING CAST COTTON 6X4 STRL (CAST SUPPLIES) ×2 IMPLANT
PENCIL SMOKE EVACUATOR (MISCELLANEOUS) ×1 IMPLANT
PORT APPOLLO RF 90DEGREE MULTI (SURGICAL WAND) ×1 IMPLANT
SHEET MEDIUM DRAPE 40X70 STRL (DRAPES) ×2 IMPLANT
SLEEVE SCD COMPRESS KNEE MED (STOCKING) ×1 IMPLANT
SPONGE T-LAP 18X18 ~~LOC~~+RFID (SPONGE) ×3 IMPLANT
STRIP CLOSURE SKIN 1/2X4 (GAUZE/BANDAGES/DRESSINGS) ×1 IMPLANT
SUCTION FRAZIER HANDLE 10FR (MISCELLANEOUS) ×2
SUCTION TUBE FRAZIER 10FR DISP (MISCELLANEOUS) IMPLANT
SUT ETHILON 3 0 FSL (SUTURE) ×1 IMPLANT
SUT ETHILON 3 0 PS 1 (SUTURE) ×6 IMPLANT
SUT FIBERWIRE #2 38 T-5 BLUE (SUTURE) ×4
SUT MNCRL AB 4-0 PS2 18 (SUTURE) IMPLANT
SUT VIC AB 1 CT1 27 (SUTURE) ×2
SUT VIC AB 1 CT1 27XBRD ANBCTR (SUTURE) IMPLANT
SUT VIC AB 2-0 CT1 27 (SUTURE) ×6
SUT VIC AB 2-0 CT1 TAPERPNT 27 (SUTURE) IMPLANT
SUT VIC AB 2-0 SH 27 (SUTURE)
SUT VIC AB 2-0 SH 27XBRD (SUTURE) IMPLANT
SUT VIC AB 3-0 SH 27 (SUTURE)
SUT VIC AB 3-0 SH 27X BRD (SUTURE) IMPLANT
SUT VICRYL 4-0 PS2 18IN ABS (SUTURE) IMPLANT
SUTURE FIBERWR #2 38 T-5 BLUE (SUTURE) IMPLANT
SUTURE TAPE 1.3 FIBERLOP 20 ST (SUTURE) IMPLANT
SUTURE TIGERSTICK 2 TIGERWIR 2 (MISCELLANEOUS) IMPLANT
SUTURETAPE 1.3 FIBERLOOP 20 ST (SUTURE)
TIGERSTICK 2 TIGERWIRE 2 (MISCELLANEOUS)
TOWEL GREEN STERILE FF (TOWEL DISPOSABLE) ×4 IMPLANT
TUBE CONNECTING 20X1/4 (TUBING) ×2 IMPLANT
TUBING ARTHROSCOPY IRRIG 16FT (MISCELLANEOUS) ×2 IMPLANT
WATER STERILE IRR 1000ML POUR (IV SOLUTION) ×2 IMPLANT
YANKAUER SUCT BULB TIP NO VENT (SUCTIONS) ×1 IMPLANT

## 2020-09-17 NOTE — H&P (Signed)
    PREOPERATIVE H&P  Chief Complaint: right knee anterior cruciate ligament tear, medial meniscal tear, lateral meniscal tear  HPI: Ronald Rogers is a 30 y.o. male who presents for surgical treatment of right knee anterior cruciate ligament tear, medial meniscal tear, lateral meniscal tear.  He denies any changes in medical history.  Past Medical History:  Diagnosis Date   Chlamydia    Past Surgical History:  Procedure Laterality Date   WISDOM TOOTH EXTRACTION     Social History   Socioeconomic History   Marital status: Single    Spouse name: Not on file   Number of children: Not on file   Years of education: Not on file   Highest education level: Not on file  Occupational History   Not on file  Tobacco Use   Smoking status: Some Days    Types: Cigarettes   Smokeless tobacco: Never  Substance and Sexual Activity   Alcohol use: Yes    Comment: rarely   Drug use: No   Sexual activity: Yes    Birth control/protection: None    Comment: mul[employer] partners currently reports 2  Other Topics Concern   Not on file  Social History Narrative   Not on file   Social Determinants of Health   Financial Resource Strain: Not on file  Food Insecurity: Not on file  Transportation Needs: Not on file  Physical Activity: Not on file  Stress: Not on file  Social Connections: Not on file   History reviewed. No pertinent family history. No Known Allergies Prior to Admission medications   Medication Sig Start Date End Date Taking? Authorizing Provider  lidocaine (LIDODERM) 5 % Place 1 patch onto the skin daily. Remove & Discard patch within 12 hours or as directed by MD 07/15/20  Yes Henderly, Britni A, PA-C  methocarbamol (ROBAXIN) 500 MG tablet Take 1 tablet (500 mg total) by mouth 2 (two) times daily. 07/15/20  Yes Henderly, Britni A, PA-C  naproxen (NAPROSYN) 500 MG tablet Take 1 tablet (500 mg total) by mouth 2 (two) times daily. 07/15/20  Yes Henderly, Britni A, PA-C   amoxicillin (AMOXIL) 500 MG capsule Take 1 capsule (500 mg total) by mouth 3 (three) times daily. 04/02/16   Lurene Shadow, PA-C     Positive ROS: All other systems have been reviewed and were otherwise negative with the exception of those mentioned in the HPI and as above.  Physical Exam: General: Alert, no acute distress Cardiovascular: No pedal edema Respiratory: No cyanosis, no use of accessory musculature GI: abdomen soft Skin: No lesions in the area of chief complaint Neurologic: Sensation intact distally Psychiatric: Patient is competent for consent with normal mood and affect Lymphatic: no lymphedema  MUSCULOSKELETAL: exam stable  Assessment: right knee anterior cruciate ligament tear, medial meniscal tear, lateral meniscal tear  Plan: Plan for Procedure(s): RIGHT KNEE ACL RECONSTRUCTION, MEDIAL MENISCAL REPAIR vs PARTIAL MEDIAL MENISCECTOMY, LATERAL MENISCAL REPAIR vs PARTIAL LATERAL MENISCECTOMY  The risks benefits and alternatives were discussed with the patient including but not limited to the risks of nonoperative treatment, versus surgical intervention including infection, bleeding, nerve injury,  blood clots, cardiopulmonary complications, morbidity, mortality, among others, and they were willing to proceed.   Preoperative templating of the joint replacement has been completed, documented, and submitted to the Operating Room personnel in order to optimize intra-operative equipment management.   Glee Arvin, MD 09/17/2020 10:56 AM

## 2020-09-17 NOTE — Transfer of Care (Signed)
Immediate Anesthesia Transfer of Care Note  Patient: Ronald Rogers  Procedure(s) Performed: RIGHT KNEE MEDIAL MENISCAL REPAIR; PARTIAL LATERAL MENISCECTOMY (Right: Knee) ANTERIOR CRUCIATE LIGAMENT RECONSTRUCTION (Right: Knee)  Patient Location: PACU  Anesthesia Type:General and Regional  Level of Consciousness: drowsy  Airway & Oxygen Therapy: Patient Spontanous Breathing and Patient connected to face mask oxygen  Post-op Assessment: Report given to RN and Post -op Vital signs reviewed and stable  Post vital signs: Reviewed and stable  Last Vitals:  Vitals Value Taken Time  BP 88/65 09/17/20 1535  Temp    Pulse 72 09/17/20 1541  Resp 22 09/17/20 1541  SpO2 98 % 09/17/20 1541  Vitals shown include unvalidated device data.  Last Pain:  Vitals:   09/17/20 1117  TempSrc: Oral  PainSc: 0-No pain         Complications: No notable events documented.

## 2020-09-17 NOTE — Anesthesia Preprocedure Evaluation (Signed)
Anesthesia Evaluation  Patient identified by MRN, date of birth, ID band Patient awake    Reviewed: Allergy & Precautions, H&P , NPO status , Patient's Chart, lab work & pertinent test results  Airway Mallampati: II   Neck ROM: full    Dental   Pulmonary Current Smoker,    breath sounds clear to auscultation       Cardiovascular negative cardio ROS   Rhythm:regular Rate:Normal     Neuro/Psych    GI/Hepatic   Endo/Other  obese  Renal/GU      Musculoskeletal   Abdominal   Peds  Hematology   Anesthesia Other Findings   Reproductive/Obstetrics                             Anesthesia Physical Anesthesia Plan  ASA: 2  Anesthesia Plan: General   Post-op Pain Management:  Regional for Post-op pain   Induction: Intravenous  PONV Risk Score and Plan: 1 and Ondansetron, Dexamethasone, Midazolam and Treatment may vary due to age or medical condition  Airway Management Planned: LMA  Additional Equipment:   Intra-op Plan:   Post-operative Plan: Extubation in OR  Informed Consent: I have reviewed the patients History and Physical, chart, labs and discussed the procedure including the risks, benefits and alternatives for the proposed anesthesia with the patient or authorized representative who has indicated his/her understanding and acceptance.     Dental advisory given  Plan Discussed with: CRNA, Anesthesiologist and Surgeon  Anesthesia Plan Comments:         Anesthesia Quick Evaluation

## 2020-09-17 NOTE — Progress Notes (Signed)
Assisted Dr. Hodierne with right, ultrasound guided, femoral block. Side rails up, monitors on throughout procedure. See vital signs in flow sheet. Tolerated Procedure well. 

## 2020-09-17 NOTE — Discharge Instructions (Addendum)
Post-operative patient instructions Knee ACL Reconstruction   Right knee ACL reconstruction with medial meniscus repair and lateral meniscus debridement. You also had arthroscopic debridement of inflammation tissue. Ice: Place intermittent ice or cooler pack over your knee, 30 minutes on and 30 minutes off. Continue this for the first 72 hours after surgery, then save ice for use after therapy sessions or on more active days.  Weight: You may place weight on your leg as your symptoms allow (with brace and crutches)  Brace: You have a knee brace locked holding your knee straight placed over your surgical dressings. Keep brace on for walking until physical therapist or physician sees your strength and leg control improve and discontinues brace. Wear brace also at night to help with knee straightening.  Crutches: Use crutches to assist in walking until told to discontinue by your physical therapist or physician. This will help to reduce pain.  Strengthening: Perform simple thigh squeezes (isometric quad contractions) and straight leg lifts as you are able (3 sets of 5 to 10 repetitions, 3 times a day). For the leg lifts, have someone support under your ankle in the beginning until you have increased strength enough to do this on your own. To help get started on thigh squeezes, place a pillow under your knee and push down on the pillow with back of knee (sometimes easier to do than with your leg fully straight).  Motion: Perform gentle knee motion as tolerated - this is gentle bending and straightening of the knee. Seated heel slides: you can start by sitting in a chair, remove your brace, and gently slide your heel back on the floor - allowing your knee to bend. Have someone help you straighten your knee (or use your other leg/foot hooked under your ankle. Also spend time working on knee straightening by placing a folded towel under your foot/heel and allow gravity to help knee fall straight (5-10 min at a  time 3-4 x per day)  Dressing: Perform 1st dressing change at 4-5 days postoperative. A moderate amount of blood tinged drainage is to be expected. So if you bleed through the dressing on the first or second day or if you have fevers, it is fine to change the dressing/check the wounds early, recover with new gauze and tegaderm (water resistant) as shown by MD, and rewrap with an ace bandage. Elevate your leg. If it bleeds through again, or if the incisions are leaking frank blood, please call the office. May change dressing every 1-2 days thereafter to check wound appearance. Many pharmacies have a similar water resistant dressing (ex 84M Nexcare).  Shower: Keep wounds dry and covered x 14 days. Do not get wound wet until sutures removed. MD has provided you with initial tegaderm dressing supplies to place over simple dry gauze applied to incisions.  Pain medication: A narcotic pain medication has been prescribed. Take as directed. Typically you need narcotic pain medication more regularly during the first 3 to 5 days after surgery. Decrease your use of the medication as the pain improves. Narcotics can sometimes cause constipation, even after a few doses. If you have problems with constipation, you can take an over the counter stool softener or light laxative. If you have persistent problems, please notify your physician's office.  Physical therapy: Additonal activity guidelines to be provided by your physician or physical therapist at follow-up visits.  Call [phone] for questions or problems. Evenings you will be forwarded to the hospital operator. Ask for the orthopaedic physician  on call. Please call if you experience:  Redness, cloudy, or foul smelling drainage at the surgical site  worsening knee pain and swelling not responsive to medication  any calf pain and or swelling of the lower leg and foot  medication intolerance  temperature greater than 100.5*F, especially during the first 3-4 weeks  post surgical  other questions or concerns Thank you for allowing [place] to be a part of your care.       Post Anesthesia Home Care Instructions  Activity: Get plenty of rest for the remainder of the day. A responsible individual must stay with you for 24 hours following the procedure.  For the next 24 hours, DO NOT: -Drive a car -Advertising copywriter -Drink alcoholic beverages -Take any medication unless instructed by your physician -Make any legal decisions or sign important papers.  Meals: Start with liquid foods such as gelatin or soup. Progress to regular foods as tolerated. Avoid greasy, spicy, heavy foods. If nausea and/or vomiting occur, drink only clear liquids until the nausea and/or vomiting subsides. Call your physician if vomiting continues.  Special Instructions/Symptoms: Your throat may feel dry or sore from the anesthesia or the breathing tube placed in your throat during surgery. If this causes discomfort, gargle with warm salt water. The discomfort should disappear within 24 hours.  If you had a scopolamine patch placed behind your ear for the management of post- operative nausea and/or vomiting:  1. The medication in the patch is effective for 72 hours, after which it should be removed.  Wrap patch in a tissue and discard in the trash. Wash hands thoroughly with soap and water. 2. You may remove the patch earlier than 72 hours if you experience unpleasant side effects which may include dry mouth, dizziness or visual disturbances. 3. Avoid touching the patch. Wash your hands with soap and water after contact with the patch.        Regional Anesthesia Blocks  1. Numbness or the inability to move the "blocked" extremity may last from 3-48 hours after placement. The length of time depends on the medication injected and your individual response to the medication. If the numbness is not going away after 48 hours, call your surgeon.  2. The extremity that is blocked will  need to be protected until the numbness is gone and the  Strength has returned. Because you cannot feel it, you will need to take extra care to avoid injury. Because it may be weak, you may have difficulty moving it or using it. You may not know what position it is in without looking at it while the block is in effect.  3. For blocks in the legs and feet, returning to weight bearing and walking needs to be done carefully. You will need to wait until the numbness is entirely gone and the strength has returned. You should be able to move your leg and foot normally before you try and bear weight or walk. You will need someone to be with you when you first try to ensure you do not fall and possibly risk injury.  4. Bruising and tenderness at the needle site are common side effects and will resolve in a few days.  5. Persistent numbness or new problems with movement should be communicated to the surgeon or the [facility] [phone] [facility] [phone]).

## 2020-09-17 NOTE — Op Note (Signed)
Surgery Date: 09/17/2020  PREOPERATIVE DIAGNOSES:  1. Right knee medial and lateral meniscus tear 2. Right ACL tear   POSTOPERATIVE DIAGNOSES:  same  PROCEDURES PERFORMED:  1. Right knee arthroscopy with lACL reconstruction, quadriceps autograft 2. Right knee arthroscopy with arthroscopic partial lateral meniscectomy 3. Right knee arthroscopy with arthroscopic repair of medial meniscus 3. Right knee arthroscopy with arthroscopic chondroplasty medial femoral condyle  SURGEON: N. Eduard Roux, M.D.  ASSIST: Ciro Backer [place], [place]; necessary for the timely completion of procedure and due to complexity of procedure.  ANESTHESIA:  general  FLUIDS: Per anesthesia record.   ESTIMATED BLOOD LOSS: minimal  IMPLANTS: see below  DESCRIPTION OF PROCEDURE: Ronald Rogers is a 31 y.o.-year-old male with above mentioned conditions. Full discussion held regarding risks benefits alternatives and complications related surgical intervention. Conservative care options reviewed. All questions answered.  The patient was identified in the preoperative holding area and the operative extremity was marked. The patient was brought to the operating room and transferred to operating table in a supine position. Satisfactory general anesthesia was induced by anesthesiology.    Examination the knee joint under anesthesia was first performed.  Patient had a positive pivot shift, Lachman grade 3, anterior drawer grade 3.  Posterior lateral corner was normal.  Range of motion was normal.  Standard anterolateral, anteromedial arthroscopy portals were obtained. The anteromedial portal was obtained with a spinal needle for localization under direct visualization with subsequent diagnostic findings.   We first began with a diagnostic knee arthroscopy which revealed a full-thickness complete rupture of the ACL.  There was minimal chondromalacia throughout the knee.  There were no loose bodies.  A valgus force was  placed on the knee and there was a nondisplaced bucket-handle tear of the posterior portion of the medial meniscus approximately a centimeter in width.  When probed the meniscus did subluxate anteriorly therefore meniscal repair was performed using an all inside technique horizontal mattress using Biomet meniscal repair.  The meniscus reattached back to the capsule very well and was stable to probing.  We then placed the knee in a figure-of-four position to find the lateral meniscus tear.  He had a fairly large radial tear of the posterior horn near the root.  The majority of the posterior horn and root were intact therefore a partial lateral meniscectomy was performed using oscillating shaver back to stable margins.  Took only about 25% of the volume in that area.  The knee was then placed into extension and the patellofemoral compartment was unremarkable.  We then remove the arthroscope and turned our attention to obtaining the graft.  A longitudinal incision was made directly over the distal quadriceps tendon.  Full-thickness flaps were raised off of the tendon.  We obtained a 9.5 mm x 65 mm graft.  The defect in the quadriceps was closed with interrupted #1 Vicryl and the rest of the wound was closed in a layered fashion.  The arthroscope was then placed back into the knee for preparation of the ACL reconstruction.  The remnant of the ACL was debrided away.  A notchplasty was performed with a bur.  Hemostasis was obtained.  The femoral drill guide was then placed at 110degrees and the tunnel was drilled to 9.5 mm diameter with the flip cutter.  We then drilled the tibial tunnel at 55 degrees and the tunnel was drilled to 10 mm diameter.  The graft was then delivered through the anterior medial portal into the femoral tunnel.  We watched the  femoral button flipped on the lateral cortex and this was then confirmed under fluoroscopy.  The rest of the graft was delivered through the anterior medial portal and dunked  into the tibial tunnel.  We position the graft with a proper amount of graft in each tunnel.  This was then provisionally tensioned with the knee in full extension.  I then performed Lachman and anterior drawer and pivot shift which were now normal.  The knee was then cycled 10 times.  The graft was tensioned again and the button was placed on the tibial cortex and advanced down.  The internal brace sutures were then anchored into the tibia about a centimeter distal to the tibial tunnel using a 4.75 mm swivel lock.  The purchase was excellent.  Final tightening was performed..  The arthroscope was then placed back into the knee to confirm appropriate reconstruction of the ACL.  Gutters were checked for loose bodies.  Excess fluid was removed from the knee joint.  Incisions were closed with interrupted nylon sutures.  Sterile dressings were applied.  Patient tolerated procedure well had no immediate complications.  Suprapatellar pouch and gutters: mild synovitis or debris. Patella chondral surface: Grade 0 Trochlear chondral surface: Grade 0 Patellofemoral tracking: Normal Medial meniscus: Nondisplaced bucket-handle tear.  Medial femoral condyle weight bearing surface: Grade 0 Medial tibial plateau: Grade 0 Anterior cruciate ligament: Complete rupture Posterior cruciate ligament:stable Lateral meniscus: Radial tear posterior horn.   Lateral femoral condyle weight bearing surface: Grade 0 Lateral tibial plateau: Grade 0  DISPOSITION: The patient was awakened from general anesthetic, extubated, taken to the recovery room in medically stable condition, no apparent complications. The patient may be weightbearing as tolerated to the operative lower extremity.  Range of motion of right knee as tolerated.  Azucena Cecil, MD Marga Hoots 3:17 PM    Implant Name Type Inv. Item Serial No. Manufacturer Lot No. LRB No. Used Action  JUGGERSTITCH IMPLANT CVD - [id number] Miscellaneous JUGGERSTITCH  IMPLANT CVD  ZIMMER RECON(ORTH,TRAU,BIO,SG) [id number] Right 1 Implanted  IMPLANT TIGHTROPE FIBERTAG ACL - [id number] Orthopedic Implant IMPLANT TIGHTROPE FIBERTAG ACL  [employer] [id number] Right 1 Implanted  IMPL TIGHTROPE ABS ACL FIBERTG - [id number] Orthopedic Implant IMPL TIGHTROPE ABS ACL FIBERTG  [employer] [id number] Right 1 Implanted  IMP SYS 2ND FIX PEEK 4.75X19.1 - [id number] Miscellaneous IMP SYS 2ND FIX PEEK 4.75X19.1  [employer] [id number] Right 1 Implanted  KIT BUTTON TIGHTROPE ABS 8X12 - [id number] Anchor KIT BUTTON TIGHTROPE ABS 8X12  [employer] [id number] Right 1 Implanted

## 2020-09-17 NOTE — Anesthesia Procedure Notes (Signed)
Anesthesia Regional Block: Adductor canal block   Pre-Anesthetic Checklist: , timeout performed,  Correct Patient, Correct Site, Correct Laterality,  Correct Procedure, Correct Position, site marked,  Risks and benefits discussed,  Surgical consent,  Pre-op evaluation,  At surgeon's request and post-op pain management  Laterality: Right  Prep: chloraprep       Needles:  Injection technique: Single-shot  Needle Type: Echogenic Needle     Needle Length: 9cm  Needle Gauge: 21     Additional Needles:   Narrative:  Start time: 09/17/2020 11:36 AM End time: 09/17/2020 11:47 AM Injection made incrementally with aspirations every 5 mL.  Performed by: Personally  Anesthesiologist: Achille Rich, MD  Additional Notes: Pt tolerated the procedure well.

## 2020-09-17 NOTE — Anesthesia Procedure Notes (Signed)
Procedure Name: LMA Insertion Date/Time: 09/17/2020 12:09 PM Performed by: Burna Cash, CRNA Pre-anesthesia Checklist: Patient identified, Emergency Drugs available, Suction available and Patient being monitored Patient Re-evaluated:Patient Re-evaluated prior to induction Oxygen Delivery Method: Circle system utilized Preoxygenation: Pre-oxygenation with 100% oxygen Induction Type: IV induction Ventilation: Mask ventilation without difficulty LMA: LMA inserted LMA Size: 5.0 Number of attempts: 1 Airway Equipment and Method: Bite block Placement Confirmation: positive ETCO2 Tube secured with: Tape Dental Injury: Teeth and Oropharynx as per pre-operative assessment

## 2020-09-19 NOTE — Anesthesia Postprocedure Evaluation (Signed)
Anesthesia Post Note  Patient: Ronald Rogers  Procedure(s) Performed: RIGHT KNEE MEDIAL MENISCAL REPAIR; PARTIAL LATERAL MENISCECTOMY (Right: Knee) ANTERIOR CRUCIATE LIGAMENT RECONSTRUCTION (Right: Knee)     Patient location during evaluation: PACU Anesthesia Type: General Level of consciousness: awake and alert Pain management: pain level controlled Vital Signs Assessment: post-procedure vital signs reviewed and stable Respiratory status: spontaneous breathing, nonlabored ventilation, respiratory function stable and patient connected to nasal cannula oxygen Cardiovascular status: blood pressure returned to baseline and stable Postop Assessment: no apparent nausea or vomiting Anesthetic complications: no   No notable events documented.  Last Vitals:  Vitals:   09/17/20 1615 09/17/20 1715  BP: (!) 133/92 (!) 150/92  Pulse: 68 73  Resp: (!) 21 17  Temp:  37.1 C  SpO2: 98% 96%    Last Pain:  Vitals:   09/17/20 1639  TempSrc:   PainSc: 0-No pain                 Sanora Cunanan S

## 2020-09-24 ENCOUNTER — Ambulatory Visit (INDEPENDENT_AMBULATORY_CARE_PROVIDER_SITE_OTHER): Payer: Self-pay | Admitting: Physician Assistant

## 2020-09-24 ENCOUNTER — Other Ambulatory Visit: Payer: Self-pay

## 2020-09-24 DIAGNOSIS — Z9889 Other specified postprocedural states: Secondary | ICD-10-CM

## 2020-09-24 MED ORDER — OXYCODONE-ACETAMINOPHEN 5-325 MG PO TABS: 1.0000 | ORAL_TABLET | Freq: Three times a day (TID) | ORAL | 0 refills | Status: DC | PRN

## 2020-09-24 NOTE — Progress Notes (Signed)
   Post-Op Visit Note   Patient: Ronald Rogers           Date of Birth: 18-Nov-1990           MRN: [id number] Visit Date: 09/24/2020 PCP: Patient, No Pcp Per (Inactive)   Assessment & Plan:  Chief Complaint:  Chief Complaint  Patient presents with   Right Knee - Routine Post Op   Visit Diagnoses:  1. S/P ACL reconstruction     Plan: Patient is a pleasant [age] year old gentleman who comes in today 1 week out right knee arthroscopy with medial meniscus repair, lateral meniscus debridement and ACL reconstruction using quadriceps autograft, date of surgery 09/17/2020.  He has been doing well.  He has been compliant weightbearing in a knee immobilizer.  He is using crutches as needed.  He has been taking pain medication as needed and is requesting a refill today.  Examination of his right knee reveals a moderate amount of swelling.  He has several well-healing surgical incisions with nylon sutures in place.  No evidence of infection or cellulitis.  Calf is soft nontender.  He is neurovascular intact distally.  Today,portal sutures removed and steri strips applied.  Intraoperative pictures reviewed.  I have placed an internal referral for physical therapy for ACL reconstruction protocol.  Home exercises also provided to the patient.  This will start out with range of motion only.  He will continue wearing his knee immobilizer until he has enough quad strength to straight leg raise which will hopefully be soon.  He will follow up with [place] next week for the remaining sutures to be removed.  Follow-Up Instructions: Return in about 1 week (around 10/01/2020).   Orders:  Orders Placed This Encounter  Procedures   Ambulatory referral to Physical Therapy   Meds ordered this encounter  Medications   oxyCODONE-acetaminophen (PERCOCET) 5-325 MG tablet    Sig: Take 1 tablet by mouth every 8 (eight) hours as needed for severe pain.    Dispense:  30 tablet    Refill:  0    Imaging: No new  imaging  PMFS History: Patient Active Problem List   Diagnosis Date Noted   Complete tear of right ACL, initial encounter 09/17/2020   Acute medial meniscus tear, right, initial encounter 09/17/2020   Acute tear lateral meniscus, left, initial encounter 09/17/2020   Past Medical History:  Diagnosis Date   Chlamydia     No family history on file.  Past Surgical History:  Procedure Laterality Date   WISDOM TOOTH EXTRACTION     Social History   Occupational History   Not on file  Tobacco Use   Smoking status: Some Days    Types: Cigarettes   Smokeless tobacco: Never  Substance and Sexual Activity   Alcohol use: Yes    Comment: rarely   Drug use: No   Sexual activity: Yes    Birth control/protection: None    Comment: mulitple partners currently reports 2

## 2020-09-26 ENCOUNTER — Encounter: Payer: Medicaid Other | Admitting: Orthopaedic Surgery

## 2020-10-01 ENCOUNTER — Encounter: Payer: Self-pay | Admitting: Orthopaedic Surgery

## 2020-10-01 ENCOUNTER — Ambulatory Visit (INDEPENDENT_AMBULATORY_CARE_PROVIDER_SITE_OTHER): Payer: Self-pay | Admitting: Physician Assistant

## 2020-10-01 ENCOUNTER — Other Ambulatory Visit: Payer: Self-pay

## 2020-10-01 DIAGNOSIS — S83241A Other tear of medial meniscus, current injury, right knee, initial encounter: Secondary | ICD-10-CM

## 2020-10-01 DIAGNOSIS — S83282A Other tear of lateral meniscus, current injury, left knee, initial encounter: Secondary | ICD-10-CM

## 2020-10-01 DIAGNOSIS — S83511A Sprain of anterior cruciate ligament of right knee, initial encounter: Secondary | ICD-10-CM

## 2020-10-01 NOTE — Progress Notes (Signed)
   Post-Op Visit Note   Patient: Ronald Rogers           Date of Birth: March 26, 1990           MRN: [id number] Visit Date: 10/01/2020 PCP: Patient, No Pcp Per (Inactive)   Assessment & Plan:  Chief Complaint:  Chief Complaint  Patient presents with   Right Knee - Routine Post Op   Visit Diagnoses:  1. Complete tear of right ACL, initial encounter   2. Acute tear lateral meniscus, left, initial encounter   3. Acute medial meniscus tear, right, initial encounter     Plan: Patient is a pleasant 30 year old gentleman who comes in today 2 weeks out right knee ACL reconstruction, date of surgery 09/17/2020.  He has been doing well.  He was seen by [place] last week where portal sutures were removed.  He has been compliant wearing his knee immobilizer and using a CPM machine.  He is scheduled to start outpatient physical therapy tomorrow.  Examination of his right knee reveals well-healed surgical incisions with nylon sutures in place.  No evidence of infection or cellulitis.  Today, the remaining stitches were removed and Steri-Strips are applied.  He was still unable to straight leg raise but will continue to work on this at home as well as when he starts therapy tomorrow.  He may then discontinue the knee immobilizer.  He will follow-up with [place] in 4 weeks time for repeat evaluation.  Call with concerns or questions in the meantime.  Follow-Up Instructions: Return in about 4 weeks (around 10/29/2020).   Orders:  No orders of the defined types were placed in this encounter.  No orders of the defined types were placed in this encounter.   Imaging: No new imaging  PMFS History: Patient Active Problem List   Diagnosis Date Noted   Complete tear of right ACL, initial encounter 09/17/2020   Acute medial meniscus tear, right, initial encounter 09/17/2020   Acute tear lateral meniscus, left, initial encounter 09/17/2020   Past Medical History:  Diagnosis Date   Chlamydia     History  reviewed. No pertinent family history.  Past Surgical History:  Procedure Laterality Date   WISDOM TOOTH EXTRACTION     Social History   Occupational History   Not on file  Tobacco Use   Smoking status: Some Days    Types: Cigarettes   Smokeless tobacco: Never  Substance and Sexual Activity   Alcohol use: Yes    Comment: rarely   Drug use: No   Sexual activity: Yes    Birth control/protection: None    Comment: mulitple partners currently reports 2

## 2020-10-02 ENCOUNTER — Ambulatory Visit: Payer: Medicaid Other | Attending: Physician Assistant | Admitting: Physical Therapy

## 2020-10-02 ENCOUNTER — Encounter (HOSPITAL_BASED_OUTPATIENT_CLINIC_OR_DEPARTMENT_OTHER): Payer: Self-pay | Admitting: Orthopaedic Surgery

## 2020-10-02 DIAGNOSIS — M25561 Pain in right knee: Secondary | ICD-10-CM | POA: Insufficient documentation

## 2020-10-02 DIAGNOSIS — M6281 Muscle weakness (generalized): Secondary | ICD-10-CM | POA: Insufficient documentation

## 2020-10-02 DIAGNOSIS — R2689 Other abnormalities of gait and mobility: Secondary | ICD-10-CM | POA: Insufficient documentation

## 2020-10-02 NOTE — Therapy (Signed)
[facility] [address] [place], [place], [address] Phone: [phone]   Fax:  [phone]  Physical Therapy Evaluation  Patient Details  Name: Ronald Rogers MRN: [id number] Date of Birth: 27-May-1990 Referring Provider (PT): Cristie Hem, [place]  Encounter Date: 10/02/2020   PT End of Session - 10/02/20 1247     Visit Number 1    Number of Visits 24    Date for PT Re-Evaluation 12/25/20    Authorization Type [place] [facility]    PT Start Time 1149    PT Stop Time 1230    PT Time Calculation (min) 41 min             Past Medical History:  Diagnosis Date   Chlamydia     Past Surgical History:  Procedure Laterality Date   ANTERIOR CRUCIATE LIGAMENT REPAIR Right 09/17/2020   Procedure: ANTERIOR CRUCIATE LIGAMENT RECONSTRUCTION;  Surgeon: Tarry Kos, MD;  Location: [facility];  Service: Orthopedics;  Laterality: Right;   KNEE ARTHROSCOPY WITH MENISCAL REPAIR Right 09/17/2020   Procedure: RIGHT KNEE MEDIAL MENISCAL REPAIR; PARTIAL LATERAL MENISCECTOMY;  Surgeon: Tarry Kos, MD;  Location: [facility];  Service: Orthopedics;  Laterality: Right;   WISDOM TOOTH EXTRACTION      There were no vitals filed for this visit.    Subjective Assessment - 10/02/20 1245     Subjective Ronald Rogers is a 30 y.o. male who presents to clinic with chief complaint of R knee pain and stiffness following ACL repair via quad tendon autograft with medial meniscus repair 09/17/2020.  MOI/History of condition: initial injury happened while playing basketball and landing with a valgus force on knee.  Pain location: R diffuse knee pain.  Red flags: denies signs of infection.  48 hour pain intensity:  highest 6/10, current 5/10, best 5/10.  Aggs: walking, movement.  Eases: rest, CPM machine.  Nature:  Severity: mod.  Irritability: mod.  Stage: sub acute.  Stability: getting better.  24 hour pattern: NA.   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  Functional limitations/goals: be able to workout, play with 39 yo son.  Home environment: lives with parents at home, 2 STE, 2 story house with hand rails.  Assistive device: crutches.   Hand dominance: R.  Falls: none.    Pertinent History Significant PMH: none                [facility] PT Assessment - 10/02/20 0001       Assessment   Medical Diagnosis Referral diagnosis: S/P ACL reconstruction (N23.557)    Referring Provider (PT) Cristie Hem, PA-C    Onset Date/Surgical Date 09/17/20    Hand Dominance Right    Next MD Visit unknown    Prior Therapy none      Precautions   Precaution Comments ACL (quad tendon allograft), with medial mensicus repair 9/14      Restrictions   Weight Bearing Restrictions Yes    Other Position/Activity Restrictions ACL (quad tendon allograft), with medial mensicus repair 9/14      Balance Screen   Has the patient fallen in the past 6 months No      Prior Function   Level of Independence Independent      Observation/Other Assessments   Observations pt ambulates into clinic with crutches, TTWB, with soft knee emobilizer    Skin Integrity incisions and portals well healed with no sign of infection    Focus  on Therapeutic Outcomes (FOTO)  NA      ROM / Strength   AROM / PROM / Strength Strength;PROM      PROM   PROM Assessment Site Knee    Right/Left Knee Right;Left    Right Knee Extension 0    Right Knee Flexion 75    Left Knee Extension --   10 degrees hyper ext   Left Knee Flexion --   WNL     Strength   Overall Strength Comments L LE WNL, R LE not tested; unable to complete SLR, mod volitional quad activation with quad setting    Strength Assessment Site Knee    Right/Left Knee Right;Left      Flexibility   Soft Tissue Assessment /Muscle Length yes                        Objective measurements completed on examination: See above findings.                 PT Education - 10/02/20 1246     Education Details POC, diagnosis, prognosis, HEP.  Pt educated via explanation, demonstration, and handout (HEP).  Pt confirms understanding verbally.              PT Short Term Goals - 10/02/20 1248       PT SHORT TERM GOAL #1   Title Ronald Rogers will be >75% HEP compliant to improve carryover between sessions and facilitate independent management of condition    Target Date 10/30/20      PT SHORT TERM GOAL #2   Title Ronald Rogers will be compliant with post op precautions throughout therapy.    Target Date 10/30/20               PT Long Term Goals - 10/02/20 1248       PT LONG TERM GOAL #1   Title Ronald Rogers will improve the following MMTs to >/= 4+/5 to show improvement in strength:  R knee ext and flexion  EVAL: unable to test  target date: 12/25/20    Baseline unable to test d/t post op precautions    Target Date 12/25/20      PT LONG TERM GOAL #2   Title Ronald Rogers will demonstrate R LE hop test within 90% of L LE  EVAL: unable to test  target date: 12/25/20    Baseline unable to test d/t post op precuations    Target Date 12/25/20      PT LONG TERM GOAL #3   Title Ronald Rogers will be able to stand for >1' in SLS stance on R LE on uneven surface, to show a significant improvement in balance in order to reduce fall risk  EVAL: unable  target date: 12/25/20    Baseline unable to test d/t post op precuations    Target Date 12/25/20      PT LONG TERM GOAL #4   Title Ronald Rogers will achieve symmetrical knee ROM  EVAL: R knee 0-75, L knee 10-0-130 degrees  target date: 12/25/20    Baseline R knee 0-75, L knee 10-0-130 degrees    Target Date 12/25/20                    Plan - 10/02/20 1248     Clinical Impression Statement Ronald Rogers is a 30 y.o. male who presents to clinic with signs and sxs consistent  with ACL repair via quad tendon autograft  with medial meniscus repair.  His L knee does have ~10 degrees of hyper ext, so ensuring he reaches this with his R knee should be the focus of therapy innitially along with regaining quad control.    Pt presents with pain and impairments/deficits in: R knee ROM, R knee and LE strength.  Activity limitations include: squatting, ambulation, steps.  Participation limitations include: ambulation in the community, playing with son, working out.  Pt will benefit from skilled therapy to address pain and the listed deficits in order to achieve functional goals, enable safety and independence in completion of daily tasks, and return to PLOF.    Stability/Clinical Decision Making Stable/Uncomplicated    Clinical Decision Making Low    Rehab Potential Good    PT Frequency 2x / week    PT Duration 12 weeks    PT Treatment/Interventions ADLs/Self Care Home Management;Aquatic Therapy;Electrical Stimulation;Iontophoresis 4mg /ml Dexamethasone;Gait training;Therapeutic activities;Therapeutic exercise;Neuromuscular re-education;Manual techniques;Dry needling;Vasopneumatic Device;Joint Manipulations    PT Next Visit Plan work to regain = knee ext, work on SLR, reinforce precautions, follow up on HEP, check HS length    PT Home Exercise Plan [email or web address]    Consulted and Agree with Plan of Care Patient             Patient will benefit from skilled therapeutic intervention in order to improve the following deficits and impairments:  Abnormal gait, Decreased balance, Decreased endurance, Difficulty walking, Pain, Decreased strength  Visit Diagnosis: Right knee pain, unspecified chronicity  Muscle weakness  Other abnormalities of gait and mobility  Balance problem     Problem List Patient Active Problem List   Diagnosis Date Noted   Complete tear of right ACL, initial encounter 09/17/2020   Acute medial meniscus tear, right, initial encounter 09/17/2020   Acute tear lateral meniscus, left, initial  encounter 09/17/2020    09/19/2020, PT 10/02/2020, 12:57 PM  [facility] [address] [place], [place], [place] Phone: [phone]   Fax:  [phone]  Name: Ronald Rogers MRN: Ronald Rogers Date of Birth: 04-21-1990

## 2020-10-02 NOTE — Patient Instructions (Addendum)
Access Code: [email or web address] URL: [email or web address] Date: 10/02/2020 Prepared by: Alphonzo Severance  Exercises Sitting Heel Slide with Towel - 1 x daily - 7 x weekly - 3 sets - 10 reps (not to exceed 90 degrees) Long Sitting Quad Set with Towel Roll Under Heel - 1 x daily - 7 x weekly - 3 sets - 10 reps

## 2020-10-06 ENCOUNTER — Ambulatory Visit: Payer: Self-pay | Attending: Physician Assistant | Admitting: Physical Therapy

## 2020-10-06 ENCOUNTER — Other Ambulatory Visit: Payer: Self-pay

## 2020-10-06 ENCOUNTER — Encounter: Payer: Self-pay | Admitting: Physical Therapy

## 2020-10-06 DIAGNOSIS — M6281 Muscle weakness (generalized): Secondary | ICD-10-CM | POA: Insufficient documentation

## 2020-10-06 DIAGNOSIS — R2689 Other abnormalities of gait and mobility: Secondary | ICD-10-CM | POA: Insufficient documentation

## 2020-10-06 DIAGNOSIS — M25561 Pain in right knee: Secondary | ICD-10-CM | POA: Insufficient documentation

## 2020-10-06 NOTE — Therapy (Signed)
[facility] Outpatient Rehabilitation [facility] [address] [place], [place], [address] Phone: [phone]   Fax:  [phone]  Physical Therapy Treatment  Patient Details  Name: Ronald Rogers MRN: [id number] Date of Birth: 06-20-1990 Referring Provider (PT): Cristie Hem, [place]   Encounter Date: 10/06/2020   PT End of Session - 10/06/20 1159     Visit Number 2    Number of Visits 24    Date for PT Re-Evaluation 12/25/20    Authorization Type Self Pay, FOTO    PT Start Time 1153    PT Stop Time 1232    PT Time Calculation (min) 39 min             Past Medical History:  Diagnosis Date   Chlamydia     Past Surgical History:  Procedure Laterality Date   ANTERIOR CRUCIATE LIGAMENT REPAIR Right 09/17/2020   Procedure: ANTERIOR CRUCIATE LIGAMENT RECONSTRUCTION;  Surgeon: Tarry Kos, MD;  Location: [facility];  Service: Orthopedics;  Laterality: Right;   KNEE ARTHROSCOPY WITH MENISCAL REPAIR Right 09/17/2020   Procedure: RIGHT KNEE MEDIAL MENISCAL REPAIR; PARTIAL LATERAL MENISCECTOMY;  Surgeon: Tarry Kos, MD;  Location: [facility];  Service: Orthopedics;  Laterality: Right;   WISDOM TOOTH EXTRACTION      There were no vitals filed for this visit.   Subjective Assessment - 10/06/20 1158     Subjective Pt reports no pain on arrival. Some discomfort in standing at medial distal knee near incision intermittently.    Currently in Pain? No/denies                [facility] PT Assessment - 10/06/20 0001       Observation/Other Assessments   Focus on Therapeutic Outcomes (FOTO)  51%      ROM / Strength   AROM / PROM / Strength AROM      AROM   AROM Assessment Site Knee    Right/Left Knee Right    Right Knee Extension 12    Right Knee Flexion 95             [facility] Adult PT Treatment/Exercise:  Therapeutic Exercise: - Supine quad set on towel 10 sec x 10 reps, 2 sets -Heel slides with strap 10 x 2  -  Supine SLR x 10 x 2 ( -12 quad lag)  - side hip abduction 10 x 2   Manual Therapy: -   Neuromuscular re-ed: -   Therapeutic Activity: -   Self-care/Home Management: -          PT Education - 10/06/20 1236     Education Details HEP    Person(s) Educated Patient    Methods Explanation;Handout    Comprehension Verbalized understanding              PT Short Term Goals - 10/02/20 1248       PT SHORT TERM GOAL #1   Title Ronald Rogers will be >75% HEP compliant to improve carryover between sessions and facilitate independent management of condition    Target Date 10/30/20      PT SHORT TERM GOAL #2   Title Ronald Rogers will be compliant with post op precautions throughout therapy.    Target Date 10/30/20               PT Long Term Goals - 10/02/20 1248       PT LONG TERM GOAL #1   Title Ronald Collum  Rogers will improve the following MMTs to >/= 4+/5 to show improvement in strength:  R knee ext and flexion  EVAL: unable to test  target date: 12/25/20    Baseline unable to test d/t post op precautions    Target Date 12/25/20      PT LONG TERM GOAL #2   Title Ronald Rogers will demonstrate R LE hop test within 90% of L LE  EVAL: unable to test  target date: 12/25/20    Baseline unable to test d/t post op precuations    Target Date 12/25/20      PT LONG TERM GOAL #3   Title Ronald Rogers will be able to stand for >1' in SLS stance on R LE on uneven surface, to show a significant improvement in balance in order to reduce fall risk  EVAL: unable  target date: 12/25/20    Baseline unable to test d/t post op precuations    Target Date 12/25/20      PT LONG TERM GOAL #4   Title Ronald Rogers will achieve symmetrical knee ROM  EVAL: R knee 0-75, L knee 10-0-130 degrees  target date: 12/25/20    Baseline R knee 0-75, L knee 10-0-130 degrees    Target Date 12/25/20                   Plan - 10/06/20 1215     Clinical  Impression Statement Pt arrives with bilateral cruthches and knee immobilizer. He demonstrates improved active quad activation with quad set. Progressed with SLR and he demonstrats 10-12 degree quad lag. His Knee flexion AROM ihas improved to 95 degrees. He reports spending 6 hours on CPM at 100 degree knee flexion. Updated HEP to include forward and side SLR.    PT Treatment/Interventions ADLs/Self Care Home Management;Aquatic Therapy;Electrical Stimulation;Iontophoresis 4mg /ml Dexamethasone;Gait training;Therapeutic activities;Therapeutic exercise;Neuromuscular re-education;Manual techniques;Dry needling;Vasopneumatic Device;Joint Manipulations    PT Next Visit Plan work to regain = knee ext, work on SLR, reinforce precautions, follow up on HEP, check HS length, can make FOTO goal -selfpay    PT Home Exercise Plan [email or web address]    Consulted and Agree with Plan of Care Patient             Patient will benefit from skilled therapeutic intervention in order to improve the following deficits and impairments:  Abnormal gait, Decreased balance, Decreased endurance, Difficulty walking, Pain, Decreased strength  Visit Diagnosis: Right knee pain, unspecified chronicity  Muscle weakness  Other abnormalities of gait and mobility     Problem List Patient Active Problem List   Diagnosis Date Noted   Complete tear of right ACL, initial encounter 09/17/2020   Acute medial meniscus tear, right, initial encounter 09/17/2020   Acute tear lateral meniscus, left, initial encounter 09/17/2020    09/19/2020, PTA 10/06/2020, 12:36 PM  [facility] [address] [place], [place], [place] Phone: [phone]   Fax:  [phone]  Name: Ronald Rogers MRN: Ronald Rogers Date of Birth: 1990-08-08

## 2020-10-06 NOTE — Patient Instructions (Signed)
Access Code: [email or web address] URL: [email or web address] Date: 10/06/2020 Prepared by: Jannette Spanner  Exercises Sitting Heel Slide with Towel - 1 x daily - 7 x weekly - 3 sets - 10 reps Long Sitting Quad Set with Towel Roll Under Heel - 1 x daily - 7 x weekly - 3 sets - 10 reps Small Range Straight Leg Raise - 1 x daily - 7 x weekly - 2 sets - 10 reps - 3 hold Sidelying Hip Abduction - 1 x daily - 7 x weekly - 2 sets - 10 reps - 3 hold

## 2020-10-08 ENCOUNTER — Encounter: Payer: Self-pay | Admitting: Physical Therapy

## 2020-10-08 ENCOUNTER — Ambulatory Visit: Payer: Self-pay | Admitting: Physical Therapy

## 2020-10-08 ENCOUNTER — Telehealth: Payer: Self-pay | Admitting: Physical Therapy

## 2020-10-08 NOTE — Telephone Encounter (Signed)
Attempted to contact patient regarding no show to appointment. Voicemail not set up.

## 2020-10-14 ENCOUNTER — Other Ambulatory Visit: Payer: Self-pay

## 2020-10-14 ENCOUNTER — Encounter: Payer: Self-pay | Admitting: Physical Therapy

## 2020-10-14 ENCOUNTER — Ambulatory Visit: Payer: Self-pay | Admitting: Physical Therapy

## 2020-10-14 DIAGNOSIS — M25561 Pain in right knee: Secondary | ICD-10-CM

## 2020-10-14 DIAGNOSIS — M6281 Muscle weakness (generalized): Secondary | ICD-10-CM

## 2020-10-14 DIAGNOSIS — R2689 Other abnormalities of gait and mobility: Secondary | ICD-10-CM

## 2020-10-14 NOTE — Therapy (Signed)
[facility] Outpatient Rehabilitation [facility] [address] [place], [place], [address] Phone: [phone]   Fax:  [phone]  Physical Therapy Treatment  Patient Details  Name: Ronald Rogers MRN: [id number] Date of Birth: [birth year] Referring Provider (PT): Cristie Hem, [place]   Encounter Date: 10/14/2020   PT End of Session - 10/14/20 1832     Visit Number 3    Number of Visits 24    Date for PT Re-Evaluation 12/25/20    Authorization Type Self Pay, FOTO    PT Start Time 0630    PT Stop Time 0710    PT Time Calculation (min) 40 min             Past Medical History:  Diagnosis Date   Chlamydia     Past Surgical History:  Procedure Laterality Date   ANTERIOR CRUCIATE LIGAMENT REPAIR Right 09/17/2020   Procedure: ANTERIOR CRUCIATE LIGAMENT RECONSTRUCTION;  Surgeon: Tarry Kos, MD;  Location: [facility];  Service: Orthopedics;  Laterality: Right;   KNEE ARTHROSCOPY WITH MENISCAL REPAIR Right 09/17/2020   Procedure: RIGHT KNEE MEDIAL MENISCAL REPAIR; PARTIAL LATERAL MENISCECTOMY;  Surgeon: Tarry Kos, MD;  Location: [facility];  Service: Orthopedics;  Laterality: Right;   WISDOM TOOTH EXTRACTION      There were no vitals filed for this visit.   [facility] Adult PT Treatment/Exercise:   Therapeutic Exercise: - Quad set with 5# on distal femur and heel propped on foam roller - bike - 5 min at end of session - Supine SLR with russian stim 10/10 at 40 mA (concentration on completing with no lag) 10 min - Sidelying hip abduction 10 x 2 (not today)   PT Short Term Goals - 10/02/20 1248       PT SHORT TERM GOAL #1   Title Ronald Rogers will be >75% HEP compliant to improve carryover between sessions and facilitate independent management of condition    Target Date 10/30/20      PT SHORT TERM GOAL #2   Title Ronald Rogers will be compliant with post op precautions throughout therapy.    Target Date 10/30/20                PT Long Term Goals - 10/02/20 1248       PT LONG TERM GOAL #1   Title Ronald Rogers will improve the following MMTs to >/= 4+/5 to show improvement in strength:  R knee ext and flexion  EVAL: unable to test  target date: 12/25/20    Baseline unable to test d/t post op precautions    Target Date 12/25/20      PT LONG TERM GOAL #2   Title Ronald Rogers will demonstrate R LE hop test within 90% of L LE  EVAL: unable to test  target date: 12/25/20    Baseline unable to test d/t post op precuations    Target Date 12/25/20      PT LONG TERM GOAL #3   Title JEANCARLO LEFFLER will be able to stand for >1' in SLS stance on R LE on uneven surface, to show a significant improvement in balance in order to reduce fall risk  EVAL: unable  target date: 12/25/20    Baseline unable to test d/t post op precuations    Target Date 12/25/20      PT LONG TERM GOAL #4   Title Ronald Rogers will achieve symmetrical knee ROM  EVAL: R knee 0-75, L knee 10-0-130 degrees  target date: 12/25/20    Baseline R knee 0-75, L knee 10-0-130 degrees    Target Date 12/25/20                   Plan - 10/15/20 1037     Clinical Impression Statement Pt reports no increase in baseline pain following therapy  HEP was reviewed, but left unchanged    Overall, KUNAAL WALKINS is progressing well with therapy.  Today we concentrated on quad strengthening and knee range of motion.  Pt shows improved knee ext today, but is still lacking R knee ext compared to L knee.  This should continue to be focus on the short term.  Quad activation is improving.  Pt shows some lag with fatigue.  He is able to complete SLR with good form when cued.  We discussed that he should not continue completing SLR when he shows lag; he confirms undertanding.  Pt will continue to benefit from skilled physical therapy to address remaining deficits and achieve listed goals.  Continue per POC.    PT  Treatment/Interventions ADLs/Self Care Home Management;Aquatic Therapy;Electrical Stimulation;Iontophoresis 4mg /ml Dexamethasone;Gait training;Therapeutic activities;Therapeutic exercise;Neuromuscular re-education;Manual techniques;Dry needling;Vasopneumatic Device;Joint Manipulations    PT Next Visit Plan work to regain = knee ext, work on SLR, reinforce precautions, follow up on HEP, check HS length, can make FOTO goal -selfpay    PT Home Exercise Plan [email or web address]    Consulted and Agree with Plan of Care Patient             Patient will benefit from skilled therapeutic intervention in order to improve the following deficits and impairments:  Abnormal gait, Decreased balance, Decreased endurance, Difficulty walking, Pain, Decreased strength  Visit Diagnosis: Right knee pain, unspecified chronicity  Muscle weakness  Other abnormalities of gait and mobility  Balance problem     Problem List Patient Active Problem List   Diagnosis Date Noted   Complete tear of right ACL, initial encounter 09/17/2020   Acute medial meniscus tear, right, initial encounter 09/17/2020   Acute tear lateral meniscus, left, initial encounter 09/17/2020    09/19/2020, PT 10/15/2020, 10:42 AM  [facility] [address] [place], [place], [place] Phone: [phone]   Fax:  [phone]  Name: DENHAM MOSE MRN: Ronald Rogers Date of Birth: 1990/02/04

## 2020-10-17 ENCOUNTER — Encounter: Payer: Self-pay | Admitting: Physical Therapy

## 2020-10-17 ENCOUNTER — Ambulatory Visit: Payer: Self-pay | Admitting: Physical Therapy

## 2020-10-17 ENCOUNTER — Other Ambulatory Visit: Payer: Self-pay

## 2020-10-17 DIAGNOSIS — M6281 Muscle weakness (generalized): Secondary | ICD-10-CM

## 2020-10-17 DIAGNOSIS — M25561 Pain in right knee: Secondary | ICD-10-CM

## 2020-10-17 DIAGNOSIS — R2689 Other abnormalities of gait and mobility: Secondary | ICD-10-CM

## 2020-10-17 NOTE — Therapy (Signed)
[facility] Outpatient Rehabilitation [facility] [address] [place], [place], [address] Phone: [phone]   Fax:  [phone]  Physical Therapy Treatment  Patient Details  Name: Ronald Rogers MRN: [id number] Date of Birth: [birth year] Referring Provider (PT): Cristie Hem, [place]   Encounter Date: 10/17/2020   PT End of Session - 10/17/20 1140     Visit Number 4    Number of Visits 24    Date for PT Re-Evaluation 12/25/20    Authorization Type Self Pay, FOTO    PT Start Time 1140   pt arrived late   PT Stop Time 1215    PT Time Calculation (min) 35 min             Past Medical History:  Diagnosis Date   Chlamydia     Past Surgical History:  Procedure Laterality Date   ANTERIOR CRUCIATE LIGAMENT REPAIR Right 09/17/2020   Procedure: ANTERIOR CRUCIATE LIGAMENT RECONSTRUCTION;  Surgeon: Tarry Kos, MD;  Location: [facility];  Service: Orthopedics;  Laterality: Right;   KNEE ARTHROSCOPY WITH MENISCAL REPAIR Right 09/17/2020   Procedure: RIGHT KNEE MEDIAL MENISCAL REPAIR; PARTIAL LATERAL MENISCECTOMY;  Surgeon: Tarry Kos, MD;  Location: [facility];  Service: Orthopedics;  Laterality: Right;   WISDOM TOOTH EXTRACTION      There were no vitals filed for this visit.   Subjective Assessment - 10/17/20 1145     Subjective Pt reports that he is feeling better.  He has been using a bike at home for ROM.  He rates his R knee pain as 4/10             [facility] Adult PT Treatment/Exercise:   Therapeutic Exercise: - Quad set with 5# on distal femur and heel propped on foam roller - bike - 5 min at end of session - Quad set with 5# weight over distal femur - 3x10  - SLR - 3x10 - fwd step up in // 4'' step - 3x10 with bil UE support - leg press 90-5 degrees - 10# - 2x10 (TKE next session and HEP)     PT Short Term Goals - 10/02/20 1248       PT SHORT TERM GOAL #1   Title Ronald Rogers will be >75% HEP compliant  to improve carryover between sessions and facilitate independent management of condition    Target Date 10/30/20      PT SHORT TERM GOAL #2   Title Ronald Rogers will be compliant with post op precautions throughout therapy.    Target Date 10/30/20               PT Long Term Goals - 10/02/20 1248       PT LONG TERM GOAL #1   Title Ronald Rogers will improve the following MMTs to >/= 4+/5 to show improvement in strength:  R knee ext and flexion  EVAL: unable to test  target date: 12/25/20    Baseline unable to test d/t post op precautions    Target Date 12/25/20      PT LONG TERM GOAL #2   Title Ronald Rogers will demonstrate R LE hop test within 90% of L LE  EVAL: unable to test  target date: 12/25/20    Baseline unable to test d/t post op precuations    Target Date 12/25/20      PT LONG TERM GOAL #3   Title Ronald Rogers will be able  to stand for >1' in SLS stance on R LE on uneven surface, to show a significant improvement in balance in order to reduce fall risk  EVAL: unable  target date: 12/25/20    Baseline unable to test d/t post op precuations    Target Date 12/25/20      PT LONG TERM GOAL #4   Title Ronald Rogers will achieve symmetrical knee ROM  EVAL: R knee 0-75, L knee 10-0-130 degrees  target date: 12/25/20    Baseline R knee 0-75, L knee 10-0-130 degrees    Target Date 12/25/20                   Plan - 10/17/20 1258     Clinical Impression Statement Pt reports no increase in baseline pain following therapy  HEP was reviewed, but left unchanged    Overall, Ronald Rogers is progressing well with therapy.  Today we concentrated on quad strengthening, knee range of motion, and hip strengthening .  Pt shows significant improvement in quad control today and is able to complete all SLR with no lag.  We worked on some CC exercises out to the brace today to good effect.  Emphasis at home remains on regaining = knee ext and  improving quad strength.  I suggested he move to using a single crutch.  Pt will continue to benefit from skilled physical therapy to address remaining deficits and achieve listed goals.  Continue per POC.    PT Treatment/Interventions ADLs/Self Care Home Management;Aquatic Therapy;Electrical Stimulation;Iontophoresis 4mg /ml Dexamethasone;Gait training;Therapeutic activities;Therapeutic exercise;Neuromuscular re-education;Manual techniques;Dry needling;Vasopneumatic Device;Joint Manipulations    PT Next Visit Plan work to regain = knee ext, work on SLR, reinforce precautions, follow up on HEP, check HS length, can make FOTO goal -selfpay    PT Home Exercise Plan [email or web address]    Consulted and Agree with Plan of Care Patient             Patient will benefit from skilled therapeutic intervention in order to improve the following deficits and impairments:  Abnormal gait, Decreased balance, Decreased endurance, Difficulty walking, Pain, Decreased strength  Visit Diagnosis: Right knee pain, unspecified chronicity  Muscle weakness  Other abnormalities of gait and mobility  Balance problem     Problem List Patient Active Problem List   Diagnosis Date Noted   Complete tear of right ACL, initial encounter 09/17/2020   Acute medial meniscus tear, right, initial encounter 09/17/2020   Acute tear lateral meniscus, left, initial encounter 09/17/2020    09/19/2020, PT 10/17/2020, 1:01 PM  [facility] [address] [place], [place], [place] Phone: [phone]   Fax:  [phone]  Name: Ronald Rogers MRN: Ronald Rogers Date of Birth: October 15, 1990

## 2020-10-21 ENCOUNTER — Ambulatory Visit: Payer: Self-pay | Admitting: Physical Therapy

## 2020-10-21 ENCOUNTER — Encounter: Payer: Self-pay | Admitting: Physical Therapy

## 2020-10-21 ENCOUNTER — Other Ambulatory Visit: Payer: Self-pay

## 2020-10-21 DIAGNOSIS — M6281 Muscle weakness (generalized): Secondary | ICD-10-CM

## 2020-10-21 DIAGNOSIS — M25561 Pain in right knee: Secondary | ICD-10-CM

## 2020-10-21 DIAGNOSIS — R2689 Other abnormalities of gait and mobility: Secondary | ICD-10-CM

## 2020-10-21 NOTE — Therapy (Signed)
[facility] Outpatient Rehabilitation [facility] [address] [place], [place], [address] Phone: [phone]   Fax:  [phone]  Physical Therapy Treatment  Patient Details  Name: Ronald Rogers MRN: [id number] Date of Birth: 02-10-1990 Referring Provider (PT): Cristie Hem, [place]   Encounter Date: 10/21/2020   PT End of Session - 10/21/20 1145     Visit Number 5    Number of Visits 24    Date for PT Re-Evaluation 12/25/20    Authorization Type Self Pay, FOTO    PT Start Time 1145   pt arrived late   PT Stop Time 1215    PT Time Calculation (min) 30 min             Past Medical History:  Diagnosis Date   Chlamydia     Past Surgical History:  Procedure Laterality Date   ANTERIOR CRUCIATE LIGAMENT REPAIR Right 09/17/2020   Procedure: ANTERIOR CRUCIATE LIGAMENT RECONSTRUCTION;  Surgeon: Tarry Kos, MD;  Location: [facility];  Service: Orthopedics;  Laterality: Right;   KNEE ARTHROSCOPY WITH MENISCAL REPAIR Right 09/17/2020   Procedure: RIGHT KNEE MEDIAL MENISCAL REPAIR; PARTIAL LATERAL MENISCECTOMY;  Surgeon: Tarry Kos, MD;  Location: [facility];  Service: Orthopedics;  Laterality: Right;   WISDOM TOOTH EXTRACTION      There were no vitals filed for this visit.   Subjective Assessment - 10/21/20 1151     Subjective Pt reports that he has been HEP compliant.  He has been working on extending his knee at home.  He rates his R knee pain as 2/10               PT Education - 10/21/20 1203     Education Details HEP            [facility] Adult PT Treatment/Exercise:   Therapeutic Exercise: - Quad set with 5# on distal femur and heel propped on foam roller - bike - 3 min at end of session - fwd step up in // 4'' step - 3x10 with uni UE support - leg press 90-5 degrees - 10# - 2x10 ( not today) - TKE - 3x15 - blue TB - Tandem on foam (R LE in rear) - 3x30'' - Retro TM walking - 1'x3    PT Short Term  Goals - 10/02/20 1248       PT SHORT TERM GOAL #1   Title Ronald Rogers will be >75% HEP compliant to improve carryover between sessions and facilitate independent management of condition    Target Date 10/30/20      PT SHORT TERM GOAL #2   Title Ronald Rogers will be compliant with post op precautions throughout therapy.    Target Date 10/30/20               PT Long Term Goals - 10/02/20 1248       PT LONG TERM GOAL #1   Title Ronald Rogers will improve the following MMTs to >/= 4+/5 to show improvement in strength:  R knee ext and flexion  EVAL: unable to test  target date: 12/25/20    Baseline unable to test d/t post op precautions    Target Date 12/25/20      PT LONG TERM GOAL #2   Title Ronald Rogers will demonstrate R LE hop test within 90% of L LE  EVAL: unable to test  target date: 12/25/20    Baseline unable to  test d/t post op precuations    Target Date 12/25/20      PT LONG TERM GOAL #3   Title Ronald Rogers will be able to stand for >1' in SLS stance on R LE on uneven surface, to show a significant improvement in balance in order to reduce fall risk  EVAL: unable  target date: 12/25/20    Baseline unable to test d/t post op precuations    Target Date 12/25/20      PT LONG TERM GOAL #4   Title Ronald Rogers will achieve symmetrical knee ROM  EVAL: R knee 0-75, L knee 10-0-130 degrees  target date: 12/25/20    Baseline R knee 0-75, L knee 10-0-130 degrees    Target Date 12/25/20                   Plan - 10/21/20 1209     Clinical Impression Statement Pt reports no increase in baseline pain following therapy  HEP was updated and reissued to patient; pt educated on HEP, was provided handout, and verbally confirmed understanding of exercises.    Overall, Ronald Rogers is progressing well with therapy.  Today we concentrated on quad strengthening.  Pt continues to show improved quad activation.  We added in some CC quad  exercises today to good effect.  Will continue to progress as appropriate.  Pt will continue to benefit from skilled physical therapy to address remaining deficits and achieve listed goals.  Continue per POC.    PT Treatment/Interventions ADLs/Self Care Home Management;Aquatic Therapy;Electrical Stimulation;Iontophoresis 4mg /ml Dexamethasone;Gait training;Therapeutic activities;Therapeutic exercise;Neuromuscular re-education;Manual techniques;Dry needling;Vasopneumatic Device;Joint Manipulations    PT Next Visit Plan work to regain = knee ext, work on SLR, reinforce precautions, follow up on HEP, check HS length, can make FOTO goal -selfpay    PT Home Exercise Plan [email or web address]    Consulted and Agree with Plan of Care Patient             Patient will benefit from skilled therapeutic intervention in order to improve the following deficits and impairments:  Abnormal gait, Decreased balance, Decreased endurance, Difficulty walking, Pain, Decreased strength  Visit Diagnosis: Right knee pain, unspecified chronicity  Muscle weakness  Other abnormalities of gait and mobility  Balance problem     Problem List Patient Active Problem List   Diagnosis Date Noted   Complete tear of right ACL, initial encounter 09/17/2020   Acute medial meniscus tear, right, initial encounter 09/17/2020   Acute tear lateral meniscus, left, initial encounter 09/17/2020    09/19/2020, PT 10/21/2020, 12:09 PM  [facility] [address] [place], [place], [place] Phone: [phone]   Fax:  [phone]  Name: Ronald Rogers MRN: Ronald Rogers Date of Birth: 06-18-1990

## 2020-10-21 NOTE — Patient Instructions (Signed)
Access Code: [email or web address] URL: [email or web address] Date: 10/21/2020 Prepared by: Alphonzo Severance  Exercises Sitting Heel Slide with Towel - 1 x daily - 7 x weekly - 3 sets - 10 reps Long Sitting Quad Set with Towel Roll Under Heel - 1 x daily - 7 x weekly - 3 sets - 10 reps Small Range Straight Leg Raise - 1 x daily - 7 x weekly - 2 sets - 10 reps - 3 hold Sidelying Hip Abduction - 1 x daily - 7 x weekly - 2 sets - 10 reps - 3 hold Standing Terminal Knee Extension with Resistance - 2 x daily - 7 x weekly - 3 sets - 10 reps

## 2020-10-27 ENCOUNTER — Ambulatory Visit: Payer: Self-pay | Admitting: Physical Therapy

## 2020-10-27 ENCOUNTER — Encounter: Payer: Self-pay | Admitting: Physical Therapy

## 2020-10-27 ENCOUNTER — Other Ambulatory Visit: Payer: Self-pay

## 2020-10-27 DIAGNOSIS — R2689 Other abnormalities of gait and mobility: Secondary | ICD-10-CM

## 2020-10-27 DIAGNOSIS — M25561 Pain in right knee: Secondary | ICD-10-CM

## 2020-10-27 DIAGNOSIS — M6281 Muscle weakness (generalized): Secondary | ICD-10-CM

## 2020-10-27 NOTE — Therapy (Signed)
[facility] [address] [place], [place], [address] Phone: [phone]   Fax:  [phone]  Physical Therapy Treatment  Patient Details  Name: Ronald Rogers MRN: [id number] Date of Birth: 1990/11/07 Referring Provider (PT): Cristie Hem, [place]   Encounter Date: 10/27/2020   PT End of Session - 10/27/20 1155     Visit Number 6    Number of Visits 24    Date for PT Re-Evaluation 12/25/20    Authorization Type Self Pay, FOTO    PT Start Time 1150    PT Stop Time 1230    PT Time Calculation (min) 40 min             Past Medical History:  Diagnosis Date   Chlamydia     Past Surgical History:  Procedure Laterality Date   ANTERIOR CRUCIATE LIGAMENT REPAIR Right 09/17/2020   Procedure: ANTERIOR CRUCIATE LIGAMENT RECONSTRUCTION;  Surgeon: Tarry Kos, MD;  Location: [facility];  Service: Orthopedics;  Laterality: Right;   KNEE ARTHROSCOPY WITH MENISCAL REPAIR Right 09/17/2020   Procedure: RIGHT KNEE MEDIAL MENISCAL REPAIR; PARTIAL LATERAL MENISCECTOMY;  Surgeon: Tarry Kos, MD;  Location:  S[facility];  Service: Orthopedics;  Laterality: Right;   WISDOM TOOTH EXTRACTION      There were no vitals filed for this visit.   Subjective Assessment - 10/27/20 1152     Subjective Pain is no more than usual. Doing the exercises at home.    Currently in Pain? No/denies                [facility] PT Assessment - 10/27/20 0001       AROM   Right Knee Extension -4    Right Knee Flexion 112   123 with strap                  [facility] Adult PT Treatment/Exercise:   Therapeutic Exercise: - 10 x 3 SLR - Rec bike L2 x 5 min - 40 degree squats at counter 10 x2  - heel raises  -gastroc stretch right -tandem stance 60 sec  -SLS 30 sec right -6 inch step up right 3 x 10 -intermittent UE touch  - Quad set with 5# on distal femur and heel propped on foam roller (not today) - leg press  90-5 degrees - 10# - 2x10 ( not today) - TKE - 3x15 - blue TB (not today) - Retro TM walking - 1'x3 (not today)  -hamstring stretch supine with strap -heel slide with strap -side hip abduction 10 x 3  right       PT Short Term Goals - 10/02/20 1248       PT SHORT TERM GOAL #1   Title Ronald Rogers will be >75% HEP compliant to improve carryover between sessions and facilitate independent management of condition    Target Date 10/30/20      PT SHORT TERM GOAL #2   Title Ronald Rogers will be compliant with post op precautions throughout therapy.    Target Date 10/30/20               PT Long Term Goals - 10/02/20 1248       PT LONG TERM GOAL #1   Title Ronald Rogers will improve the following MMTs to >/= 4+/5 to show improvement in strength:  R knee ext and flexion  EVAL: unable to test  target date: 12/25/20    Baseline  unable to test d/t post op precautions    Target Date 12/25/20      PT LONG TERM GOAL #2   Title Ronald Rogers will demonstrate R LE hop test within 90% of L LE  EVAL: unable to test  target date: 12/25/20    Baseline unable to test d/t post op precuations    Target Date 12/25/20      PT LONG TERM GOAL #3   Title KAJ VASIL will be able to stand for >1' in SLS stance on R LE on uneven surface, to show a significant improvement in balance in order to reduce fall risk  EVAL: unable  target date: 12/25/20    Baseline unable to test d/t post op precuations    Target Date 12/25/20      PT LONG TERM GOAL #4   Title Ronald Rogers will achieve symmetrical knee ROM  EVAL: R knee 0-75, L knee 10-0-130 degrees  target date: 12/25/20    Baseline R knee 0-75, L knee 10-0-130 degrees    Target Date 12/25/20                   Plan - 10/27/20 1252     Clinical Impression Statement Renan arrives without pain and reports compliance with HEP. His SLR is nearly full. Continued per ACL protocol and pt tolerated session well  without increased pain.    PT Treatment/Interventions ADLs/Self Care Home Management;Aquatic Therapy;Electrical Stimulation;Iontophoresis 4mg /ml Dexamethasone;Gait training;Therapeutic activities;Therapeutic exercise;Neuromuscular re-education;Manual techniques;Dry needling;Vasopneumatic Device;Joint Manipulations    PT Next Visit Plan work to regain = knee ext, work on SLR, reinforce precautions, follow up on HEP, check HS length, can make FOTO goal -selfpay (recheck FOTO next visit).    PT Home Exercise Plan [email or web address]             Patient will benefit from skilled therapeutic intervention in order to improve the following deficits and impairments:  Abnormal gait, Decreased balance, Decreased endurance, Difficulty walking, Pain, Decreased strength  Visit Diagnosis: Right knee pain, unspecified chronicity  Muscle weakness  Other abnormalities of gait and mobility  Balance problem     Problem List Patient Active Problem List   Diagnosis Date Noted   Complete tear of right ACL, initial encounter 09/17/2020   Acute medial meniscus tear, right, initial encounter 09/17/2020   Acute tear lateral meniscus, left, initial encounter 09/17/2020    09/19/2020, PTA 10/27/2020, 12:54 PM  [facility] [address] [place], [place], [place] Phone: [phone]   Fax:  [phone]  Name: Ronald Rogers MRN: Ronald Rogers Date of Birth: 01-07-1990

## 2020-10-29 ENCOUNTER — Encounter: Payer: Self-pay | Admitting: Physical Therapy

## 2020-10-29 ENCOUNTER — Other Ambulatory Visit: Payer: Self-pay

## 2020-10-29 ENCOUNTER — Ambulatory Visit: Payer: Self-pay | Admitting: Physical Therapy

## 2020-10-29 DIAGNOSIS — M6281 Muscle weakness (generalized): Secondary | ICD-10-CM

## 2020-10-29 DIAGNOSIS — M25561 Pain in right knee: Secondary | ICD-10-CM

## 2020-10-29 DIAGNOSIS — R2689 Other abnormalities of gait and mobility: Secondary | ICD-10-CM

## 2020-10-29 NOTE — Therapy (Addendum)
[facility] [address] [place], [place], [address] Phone: [phone]   Fax:  [phone]  Physical Therapy Treatment  Patient Details  Name: Ronald Rogers MRN: [id number] Date of Birth: 30-Jun-1990 Referring Provider (PT): Cristie Hem, [place]   Encounter Date: 10/29/2020   PT End of Session - 10/29/20 1215     Visit Number 7    Number of Visits 24    Date for PT Re-Evaluation 12/25/20    Authorization Type Self Pay, FOTO    PT Start Time 1145   pt arrived late   PT Stop Time 1215    PT Time Calculation (min) 30 min             Past Medical History:  Diagnosis Date   Chlamydia     Past Surgical History:  Procedure Laterality Date   ANTERIOR CRUCIATE LIGAMENT REPAIR Right 09/17/2020   Procedure: ANTERIOR CRUCIATE LIGAMENT RECONSTRUCTION;  Surgeon: Tarry Kos, MD;  Location: [facility];  Service: Orthopedics;  Laterality: Right;   KNEE ARTHROSCOPY WITH MENISCAL REPAIR Right 09/17/2020   Procedure: RIGHT KNEE MEDIAL MENISCAL REPAIR; PARTIAL LATERAL MENISCECTOMY;  Surgeon: Tarry Kos, MD;  Location: [facility];  Service: Orthopedics;  Laterality: Right;   WISDOM TOOTH EXTRACTION      There were no vitals filed for this visit.         Subjective: Pt reports he his doing well with minimal pain.  1/10 R knee pain             [facility] Adult PT Treatment/Exercise - 10/29/20 0001       Blood Flow Restriction   Blood Flow Restriction Yes      Blood Flow Restriction-Positions    Blood Flow Restriction Position Sitting      BFR Sitting   Sitting Limb Occulsion Pressure (mmHg) 190    Sitting Exercise Pressure (mmHg) 143    Sitting Exercise Prescription 30,15,15,15, reps w/ 30-60 sec rest    Sitting Exercise Prescription Comment only to 45 degrees              [facility] Adult PT Treatment/Exercise:   Therapeutic Exercise:  - bike - 5 min at L 5 - LAQ to 90* -  45* with BFR - Tandem on foam (R LE in rear) - 3x30''     PT Short Term Goals - 10/02/20 1248       PT SHORT TERM GOAL #1   Title Ronald Rogers will be >75% HEP compliant to improve carryover between sessions and facilitate independent management of condition    Target Date 10/30/20      PT SHORT TERM GOAL #2   Title Ronald Rogers will be compliant with post op precautions throughout therapy.    Target Date 10/30/20               PT Long Term Goals - 10/02/20 1248       PT LONG TERM GOAL #1   Title Ronald Rogers will improve the following MMTs to >/= 4+/5 to show improvement in strength:  R knee ext and flexion  EVAL: unable to test  target date: 12/25/20    Baseline unable to test d/t post op precautions    Target Date 12/25/20      PT LONG TERM GOAL #2   Title Ronald Rogers will demonstrate R LE hop test within 90% of L LE  EVAL: unable to  test  target date: 12/25/20    Baseline unable to test d/t post op precuations    Target Date 12/25/20      PT LONG TERM GOAL #3   Title NABOR THOMANN will be able to stand for >1' in SLS stance on R LE on uneven surface, to show a significant improvement in balance in order to reduce fall risk  EVAL: unable  target date: 12/25/20    Baseline unable to test d/t post op precuations    Target Date 12/25/20      PT LONG TERM GOAL #4   Title Ronald Rogers will achieve symmetrical knee ROM  EVAL: R knee 0-75, L knee 10-0-130 degrees  target date: 12/25/20    Baseline R knee 0-75, L knee 10-0-130 degrees    Target Date 12/25/20                   Plan - 10/29/20 1214     Clinical Impression Statement Pt reports no increase in baseline pain following therapy  HEP was reviewed, but left unchanged    Overall, KESLEY GAFFEY is progressing well with therapy.  Today we concentrated on quad strengthening.  Pt tolerates BFR well with high level of muscle burn but no increase in pain.  Pt will continue  to benefit from skilled physical therapy to address remaining deficits and achieve listed goals.  Continue per POC.    PT Treatment/Interventions ADLs/Self Care Home Management;Aquatic Therapy;Electrical Stimulation;Iontophoresis 4mg /ml Dexamethasone;Gait training;Therapeutic activities;Therapeutic exercise;Neuromuscular re-education;Manual techniques;Dry needling;Vasopneumatic Device;Joint Manipulations    PT Next Visit Plan work to regain = knee ext, work on SLR, reinforce precautions, follow up on HEP, check HS length, can make FOTO goal -selfpay (recheck FOTO next visit).    PT Home Exercise Plan [email or web address]             Patient will benefit from skilled therapeutic intervention in order to improve the following deficits and impairments:  Abnormal gait, Decreased balance, Decreased endurance, Difficulty walking, Pain, Decreased strength  Visit Diagnosis: Right knee pain, unspecified chronicity  Muscle weakness  Other abnormalities of gait and mobility  Balance problem     Problem List Patient Active Problem List   Diagnosis Date Noted   Complete tear of right ACL, initial encounter 09/17/2020   Acute medial meniscus tear, right, initial encounter 09/17/2020   Acute tear lateral meniscus, left, initial encounter 09/17/2020    09/19/2020, PT 10/29/2020, 12:16 PM  [facility] [address] [place], [place], [place] Phone: [phone]   Fax:  [phone]  Name: Ronald Rogers MRN: Ronald Rogers Date of Birth: [birth year]

## 2020-11-04 ENCOUNTER — Ambulatory Visit: Payer: Self-pay | Attending: Physician Assistant | Admitting: Physical Therapy

## 2020-11-04 ENCOUNTER — Other Ambulatory Visit: Payer: Self-pay

## 2020-11-04 ENCOUNTER — Encounter: Payer: Self-pay | Admitting: Physical Therapy

## 2020-11-04 DIAGNOSIS — R2689 Other abnormalities of gait and mobility: Secondary | ICD-10-CM | POA: Insufficient documentation

## 2020-11-04 DIAGNOSIS — M25561 Pain in right knee: Secondary | ICD-10-CM | POA: Insufficient documentation

## 2020-11-04 DIAGNOSIS — M6281 Muscle weakness (generalized): Secondary | ICD-10-CM | POA: Insufficient documentation

## 2020-11-04 NOTE — Therapy (Addendum)
[facility] Outpatient Rehabilitation [facility] [address] [place], [place], [address] Phone: [phone]   Fax:  [phone]  Physical Therapy Treatment  Patient Details  Name: Ronald Rogers MRN: [id number] Date of Birth: 12-13-1990 Referring Provider (PT): Cristie Hem, [place]   Encounter Date: 11/04/2020   PT End of Session - 11/04/20 1133     Visit Number 8    Number of Visits 24    Date for PT Re-Evaluation 12/25/20    Authorization Type Self Pay, FOTO    PT Start Time 1133    PT Stop Time 1215    PT Time Calculation (min) 42 min             Past Medical History:  Diagnosis Date   Chlamydia     Past Surgical History:  Procedure Laterality Date   ANTERIOR CRUCIATE LIGAMENT REPAIR Right 09/17/2020   Procedure: ANTERIOR CRUCIATE LIGAMENT RECONSTRUCTION;  Surgeon: Tarry Kos, MD;  Location: [facility];  Service: Orthopedics;  Laterality: Right;   KNEE ARTHROSCOPY WITH MENISCAL REPAIR Right 09/17/2020   Procedure: RIGHT KNEE MEDIAL MENISCAL REPAIR; PARTIAL LATERAL MENISCECTOMY;  Surgeon: Tarry Kos, MD;  Location: [facility];  Service: Orthopedics;  Laterality: Right;   WISDOM TOOTH EXTRACTION      There were no vitals filed for this visit.  Subjective: Pt reports he continues to see improvement in his ROM he denies pain today.   [facility] Adult PT Treatment/Exercise - 11/04/20 0001       Blood Flow Restriction   Blood Flow Restriction Yes      Blood Flow Restriction-Positions    Blood Flow Restriction Position Sitting      BFR Sitting   Sitting Limb Occulsion Pressure (mmHg) 170    Sitting Exercise Pressure (mmHg) 128    Sitting Exercise Prescription 30,15,15,15, reps w/ 30-60 sec rest    Sitting Exercise Prescription Comment only to 45 degrees                   [facility] Adult PT Treatment/Exercise:   Therapeutic Exercise:   - bike - 5 min at L5 - HS curl uni - 20# - 3x10 - [place] -  black TB - 10x ea - w/ UE support - standing on R LE - forward step up  - working on controlled movement - working to no UE support - 4'' step - 2x10 - mini-squat - 3x10 - LAQ to 90* - 45* with BFR 2# - Tandem on foam (R LE in rear) - 3x30'' (not today)   PT Education - 11/04/20 1159     Education Details HEP              PT Short Term Goals - 10/02/20 1248       PT SHORT TERM GOAL #1   Title [name] will be >75% HEP compliant to improve carryover between sessions and facilitate independent management of condition    Target Date 10/30/20      PT SHORT TERM GOAL #2   Title [name] will be compliant with post op precautions throughout therapy.    Target Date 10/30/20               PT Long Term Goals - 10/02/20 1248       PT LONG TERM GOAL #1   Title [name] will improve the following MMTs to >/= 4+/5 to show improvement in strength:  R knee  ext and flexion  EVAL: unable to test  target date: 12/25/20    Baseline unable to test d/t post op precautions    Target Date 12/25/20      PT LONG TERM GOAL #2   Title [name] will demonstrate R LE hop test within 90% of L LE  EVAL: unable to test  target date: 12/25/20    Baseline unable to test d/t post op precuations    Target Date 12/25/20      PT LONG TERM GOAL #3   Title Ronald Rogers will be able to stand for >1' in SLS stance on R LE on uneven surface, to show a significant improvement in balance in order to reduce fall risk  EVAL: unable  target date: 12/25/20    Baseline unable to test d/t post op precuations    Target Date 12/25/20      PT LONG TERM GOAL #4   Title [name] will achieve symmetrical knee ROM  EVAL: R knee 0-75, L knee 10-0-130 degrees  target date: 12/25/20    Baseline R knee 0-75, L knee 10-0-130 degrees    Target Date 12/25/20                   Plan - 11/04/20 1208     Clinical Impression Statement Pt reports no increase in  baseline pain following therapy  HEP was updated and reissued to patient; pt educated on HEP, was provided handout, and verbally confirmed understanding of exercises.    Overall, JATAVIOUS PEPPARD is progressing well with therapy.  Today we concentrated on lower extremity strengthening.  Pt continue to progress with therapy.  Pt felt significant fatigue with step up today, and these were added to HEP.  Pt encouraged to make appt with MD ASAP to geat cleared to wean from brace.  Pt will continue to benefit from skilled physical therapy to address remaining deficits and achieve listed goals.  Continue per POC.    PT Treatment/Interventions ADLs/Self Care Home Management;Aquatic Therapy;Electrical Stimulation;Iontophoresis 4mg /ml Dexamethasone;Gait training;Therapeutic activities;Therapeutic exercise;Neuromuscular re-education;Manual techniques;Dry needling;Vasopneumatic Device;Joint Manipulations    PT Next Visit Plan work to regain = knee ext, work on SLR, reinforce precautions, follow up on HEP, check HS length, can make FOTO goal -selfpay (recheck FOTO next visit).    PT Home Exercise Plan [email or web address]             Patient will benefit from skilled therapeutic intervention in order to improve the following deficits and impairments:  Abnormal gait, Decreased balance, Decreased endurance, Difficulty walking, Pain, Decreased strength  Visit Diagnosis: Right knee pain, unspecified chronicity  Muscle weakness  Other abnormalities of gait and mobility  Balance problem     Problem List Patient Active Problem List   Diagnosis Date Noted   Complete tear of right ACL, initial encounter 09/17/2020   Acute medial meniscus tear, right, initial encounter 09/17/2020   Acute tear lateral meniscus, left, initial encounter 09/17/2020    09/19/2020, PT 11/04/2020, 12:27 PM  [facility] [address] [place], [place], [place] Phone:  [phone]   Fax:  [phone]  Name: AYDIN HINK MRN: [name] Date of Birth: 03-11-1990

## 2020-11-04 NOTE — Patient Instructions (Signed)
Access Code: [email or web address] URL: [email or web address] Date: 11/04/2020 Prepared by: Alphonzo Severance  Exercises Sidelying Hip Abduction - 1 x daily - 7 x weekly - 2 sets - 10 reps - 3 hold Standing Terminal Knee Extension with Resistance - 2 x daily - 7 x weekly - 3 sets - 10 reps Supine Active Straight Leg Raise - 1 x daily - 7 x weekly - 3 sets - 10 reps Step Up - 1 x daily - 7 x weekly - 3 sets - 10 reps

## 2020-11-06 ENCOUNTER — Ambulatory Visit: Payer: Self-pay | Admitting: Physical Therapy

## 2020-11-06 ENCOUNTER — Other Ambulatory Visit: Payer: Self-pay

## 2020-11-06 ENCOUNTER — Encounter: Payer: Self-pay | Admitting: Physical Therapy

## 2020-11-06 DIAGNOSIS — R2689 Other abnormalities of gait and mobility: Secondary | ICD-10-CM

## 2020-11-06 DIAGNOSIS — M6281 Muscle weakness (generalized): Secondary | ICD-10-CM

## 2020-11-06 DIAGNOSIS — M25561 Pain in right knee: Secondary | ICD-10-CM

## 2020-11-06 NOTE — Therapy (Signed)
[facility] Outpatient Rehabilitation [facility] [address] [place], [place], [address] Phone: [phone]   Fax:  [phone]  Physical Therapy Treatment  Patient Details  Name: Ronald Rogers MRN: [id number] Date of Birth: November 26, 1990 Referring Provider (PT): Cristie Hem, [place]   Encounter Date: 11/06/2020   PT End of Session - 11/06/20 1135     Visit Number 9    Number of Visits 24    Date for PT Re-Evaluation 12/25/20    Authorization Type Self Pay, FOTO    PT Start Time 1135    PT Stop Time 1215    PT Time Calculation (min) 40 min             Past Medical History:  Diagnosis Date   Chlamydia     Past Surgical History:  Procedure Laterality Date   ANTERIOR CRUCIATE LIGAMENT REPAIR Right 09/17/2020   Procedure: ANTERIOR CRUCIATE LIGAMENT RECONSTRUCTION;  Surgeon: Tarry Kos, MD;  Location: [facility];  Service: Orthopedics;  Laterality: Right;   KNEE ARTHROSCOPY WITH MENISCAL REPAIR Right 09/17/2020   Procedure: RIGHT KNEE MEDIAL MENISCAL REPAIR; PARTIAL LATERAL MENISCECTOMY;  Surgeon: Tarry Kos, MD;  Location: [facility];  Service: Orthopedics;  Laterality: Right;   WISDOM TOOTH EXTRACTION      There were no vitals filed for this visit.   Subjective Assessment - 11/06/20 1140     Subjective Pt reports that he is having minimal pain and feels like things are progressing well. 0/10 pain today.    Pertinent History Significant PMH: none             [facility] Adult PT Treatment/Exercise:   Therapeutic Exercise: - bike - 5 min at L5  Gait training: - Working on sequencing with mirror for feedback (concentration on heel off and heel strike) - Hurdle walking with mirror for feed back - fwd, bkwd, and with hip circles  Neuromuscular re-ed, to improve balance and reduce fall risk: - SLS - 30'' bouts  Patient Education: - HEP was updated and reissued to patient; pt educated on HEP, was provided handout,  and verbally confirmed understanding of exercises.    PT Short Term Goals - 11/06/20 1206       PT SHORT TERM GOAL #1   Title Ronald Rogers will be >75% HEP compliant to improve carryover between sessions and facilitate independent management of condition    Status Achieved    Target Date 10/30/20      PT SHORT TERM GOAL #2   Title Ronald Rogers will be compliant with post op precautions throughout therapy.    Status Achieved    Target Date 10/30/20               PT Long Term Goals - 10/02/20 1248       PT LONG TERM GOAL #1   Title Ronald Rogers will improve the following MMTs to >/= 4+/5 to show improvement in strength:  R knee ext and flexion  EVAL: unable to test  target date: 12/25/20    Baseline unable to test d/t post op precautions    Target Date 12/25/20      PT LONG TERM GOAL #2   Title Ronald Rogers will demonstrate R LE hop test within 90% of L LE  EVAL: unable to test  target date: 12/25/20    Baseline unable to test d/t post op precuations    Target Date 12/25/20  PT LONG TERM GOAL #3   Title Ronald Rogers will be able to stand for >1' in SLS stance on R LE on uneven surface, to show a significant improvement in balance in order to reduce fall risk  EVAL: unable  target date: 12/25/20    Baseline unable to test d/t post op precuations    Target Date 12/25/20      PT LONG TERM GOAL #4   Title Ronald Rogers will achieve symmetrical knee ROM  EVAL: R knee 0-75, L knee 10-0-130 degrees  target date: 12/25/20    Baseline R knee 0-75, L knee 10-0-130 degrees    Target Date 12/25/20                   Plan - 11/06/20 1204     Clinical Impression Statement Pt reports no increase in baseline pain following therapy.  Overall, Ronald Rogers is progressing well with therapy.  Today we concentrated on normalizing gait.  Pt does well with gait training today, but must be cued for proper sequencing.  He tends toward reduced  heel off in pre-swing on R LE and has reduced time in stance R LE without cuing.  I encouraged him to work on this at home.  Pt will continue to benefit from skilled physical therapy to address remaining deficits and achieve listed goals.  Continue per POC.    PT Treatment/Interventions ADLs/Self Care Home Management;Aquatic Therapy;Electrical Stimulation;Iontophoresis 4mg /ml Dexamethasone;Gait training;Therapeutic activities;Therapeutic exercise;Neuromuscular re-education;Manual techniques;Dry needling;Vasopneumatic Device;Joint Manipulations    PT Next Visit Plan work to regain = knee ext, work on SLR, reinforce precautions, follow up on HEP, check HS length, can make FOTO goal -selfpay (recheck FOTO next visit).    PT Home Exercise Plan [email or web address]             Patient will benefit from skilled therapeutic intervention in order to improve the following deficits and impairments:  Abnormal gait, Decreased balance, Decreased endurance, Difficulty walking, Pain, Decreased strength  Visit Diagnosis: Right knee pain, unspecified chronicity  Muscle weakness  Other abnormalities of gait and mobility     Problem List Patient Active Problem List   Diagnosis Date Noted   Complete tear of right ACL, initial encounter 09/17/2020   Acute medial meniscus tear, right, initial encounter 09/17/2020   Acute tear lateral meniscus, left, initial encounter 09/17/2020    09/19/2020, PT 11/06/2020, 12:15 PM  [facility]patient Rehabilitation [facility] [address] [place], [place], [place] Phone: [phone]   Fax:  [phone]  Name: Ronald Rogers MRN: Ronald Rogers Date of Birth: 1990-09-12

## 2020-11-06 NOTE — Patient Instructions (Signed)
Access Code: [email or web address] URL: [email or web address] Date: 11/06/2020 Prepared by: Alphonzo Severance  Exercises Sidelying Hip Abduction - 1 x daily - 7 x weekly - 2 sets - 10 reps - 3 hold Step Up - 1 x daily - 7 x weekly - 3 sets - 10 reps Single Leg Stance - 2 x daily - 6 x weekly - 1 sets - 3 reps - 30 hold

## 2020-11-11 ENCOUNTER — Ambulatory Visit: Payer: Medicaid Other | Admitting: Orthopaedic Surgery

## 2020-11-11 ENCOUNTER — Encounter: Payer: Self-pay | Admitting: Physical Therapy

## 2020-11-11 ENCOUNTER — Other Ambulatory Visit: Payer: Self-pay

## 2020-11-11 ENCOUNTER — Ambulatory Visit: Payer: Self-pay | Admitting: Physical Therapy

## 2020-11-11 DIAGNOSIS — R2689 Other abnormalities of gait and mobility: Secondary | ICD-10-CM

## 2020-11-11 DIAGNOSIS — M6281 Muscle weakness (generalized): Secondary | ICD-10-CM

## 2020-11-11 DIAGNOSIS — M25561 Pain in right knee: Secondary | ICD-10-CM

## 2020-11-11 NOTE — Therapy (Signed)
[facility] [address] [place], [place], [address] Phone: [phone]   Fax:  [phone]  Physical Therapy Treatment  Patient Details  Name: Ronald Rogers MRN: [id number] Date of Birth: Mar 13, 1990 Referring Provider (PT): Cristie Hem, [place]   Encounter Date: 11/11/2020   PT End of Session - 11/11/20 1146     Visit Number 10    Number of Visits 24    Date for PT Re-Evaluation 12/25/20    Authorization Type Self Pay, FOTO    PT Start Time 1146    PT Stop Time 1215    PT Time Calculation (min) 29 min             Past Medical History:  Diagnosis Date   Chlamydia     Past Surgical History:  Procedure Laterality Date   ANTERIOR CRUCIATE LIGAMENT REPAIR Right 09/17/2020   Procedure: ANTERIOR CRUCIATE LIGAMENT RECONSTRUCTION;  Surgeon: Tarry Kos, MD;  Location: [facility];  Service: Orthopedics;  Laterality: Right;   KNEE ARTHROSCOPY WITH MENISCAL REPAIR Right 09/17/2020   Procedure: RIGHT KNEE MEDIAL MENISCAL REPAIR; PARTIAL LATERAL MENISCECTOMY;  Surgeon: Tarry Kos, MD;  Location: [facility];  Service: Orthopedics;  Laterality: Right;   WISDOM TOOTH EXTRACTION      There were no vitals filed for this visit.   Subjective Assessment - 11/11/20 1149     Subjective Pt reports that things are going well.  He has been walking without his brace. 0/10 pain today.    Pertinent History Significant PMH: none                [facility] PT Assessment - 11/11/20 0001       AROM   Right Knee Flexion 115             [facility] Adult PT Treatment/Exercise:   Therapeutic Exercise: - bike - 3 min at L5 - Supine heel slide with towel - 20x - Prone knee flexion with strap - 2x30'' - Lateral lunges - 2x10 - squat < 60 degrees in PF - LAQ - 10# - 90-45 degrees - 3x10 (add in 5'' holds next visit)   Neuromuscular re-ed, to improve balance and reduce fall risk: - SLS on foam - 30''  bouts     PT Short Term Goals - 11/06/20 1206       PT SHORT TERM GOAL #1   Title Ronald Rogers will be >75% HEP compliant to improve carryover between sessions and facilitate independent management of condition    Status Achieved    Target Date 10/30/20      PT SHORT TERM GOAL #2   Title Ronald Rogers will be compliant with post op precautions throughout therapy.    Status Achieved    Target Date 10/30/20               PT Long Term Goals - 10/02/20 1248       PT LONG TERM GOAL #1   Title Ronald Rogers will improve the following MMTs to >/= 4+/5 to show improvement in strength:  R knee ext and flexion  EVAL: unable to test  target date: 12/25/20    Baseline unable to test d/t post op precautions    Target Date 12/25/20      PT LONG TERM GOAL #2   Title Ronald Rogers will demonstrate R LE hop test within 90% of L LE  EVAL: unable to test  target date: 12/25/20    Baseline unable to test d/t post op precuations    Target Date 12/25/20      PT LONG TERM GOAL #3   Title Ronald Rogers will be able to stand for >1' in SLS stance on R LE on uneven surface, to show a significant improvement in balance in order to reduce fall risk  EVAL: unable  target date: 12/25/20    Baseline unable to test d/t post op precuations    Target Date 12/25/20      PT LONG TERM GOAL #4   Title Ronald Rogers will achieve symmetrical knee ROM  EVAL: R knee 0-75, L knee 10-0-130 degrees  target date: 12/25/20    Baseline R knee 0-75, L knee 10-0-130 degrees    Target Date 12/25/20                   Plan - 11/11/20 1219     Clinical Impression Statement Overall, Ronald Rogers is progressing well with therapy.  Pt reports no increase in baseline pain following therapy.  Today we concentrated on quad strengthening and balance/proprioception.  Pt shows tendency toward ER during lateral lunges which improves with cuing.  We added in a bit more resistance to limited ROM LAQ  to good effect.  Will begin incorporating some endurance with this.  Main goal right now should be on normalizing gait and ensure ability to accept load on operative load in CC.  Pt will continue to benefit from skilled physical therapy to address remaining deficits and achieve listed goals.  Continue per POC.    PT Treatment/Interventions ADLs/Self Care Home Management;Aquatic Therapy;Electrical Stimulation;Iontophoresis 4mg /ml Dexamethasone;Gait training;Therapeutic activities;Therapeutic exercise;Neuromuscular re-education;Manual techniques;Dry needling;Vasopneumatic Device;Joint Manipulations    PT Next Visit Plan work to regain = knee ext, work on SLR, reinforce precautions, follow up on HEP, check HS length, can make FOTO goal -selfpay (recheck FOTO next visit).    PT Home Exercise Plan [email or web address]             Patient will benefit from skilled therapeutic intervention in order to improve the following deficits and impairments:  Abnormal gait, Decreased balance, Decreased endurance, Difficulty walking, Pain, Decreased strength  Visit Diagnosis: Right knee pain, unspecified chronicity  Muscle weakness  Other abnormalities of gait and mobility  Balance problem     Problem List Patient Active Problem List   Diagnosis Date Noted   Complete tear of right ACL, initial encounter 09/17/2020   Acute medial meniscus tear, right, initial encounter 09/17/2020   Acute tear lateral meniscus, left, initial encounter 09/17/2020    09/19/2020, PT 11/11/2020, 12:19 PM  [facility] [address] [place], [place], [place] Phone: [phone]   Fax:  [phone]  Name: Ronald Rogers MRN: Ronald Rogers Date of Birth: 11-Mar-1990

## 2020-11-13 ENCOUNTER — Ambulatory Visit: Payer: Self-pay | Admitting: Physical Therapy

## 2020-11-18 ENCOUNTER — Ambulatory Visit: Payer: Self-pay | Admitting: Physical Therapy

## 2020-11-20 ENCOUNTER — Encounter: Payer: Self-pay | Admitting: Physical Therapy

## 2020-11-20 ENCOUNTER — Ambulatory Visit: Payer: Self-pay | Admitting: Physical Therapy

## 2020-11-20 ENCOUNTER — Other Ambulatory Visit: Payer: Self-pay

## 2020-11-20 DIAGNOSIS — M6281 Muscle weakness (generalized): Secondary | ICD-10-CM

## 2020-11-20 DIAGNOSIS — M25561 Pain in right knee: Secondary | ICD-10-CM

## 2020-11-20 DIAGNOSIS — R2689 Other abnormalities of gait and mobility: Secondary | ICD-10-CM

## 2020-11-20 NOTE — Therapy (Addendum)
[place], [place], [address] Phone: [phone]   Fax:  [phone]  PHYSICAL THERAPY UNPLANNED DISCHARGE SUMMARY   Visits from Start of Care: 11  Current functional level related to goals / functional outcomes: Current status unknown   Remaining deficits: Current status unknown   Education / Equipment: Pt has not returned since visit listed below  Patient goals were not assessed. Patient is being discharged due to not returning since the last visit.  (the below note was addended to include the above D/C summary on 01/08/21)   Physical Therapy Treatment  Patient Details  Name: Ronald Rogers MRN: [id number] Date of Birth: 1990/12/05 Referring Provider (PT): Aundra Dubin, [place]   Encounter Date: 11/20/2020   PT End of Session - 11/20/20 1132     Visit Number 11    Number of Visits 24    Date for PT Re-Evaluation 12/25/20    Authorization Type Self Pay, FOTO    PT Start Time 1132    PT Stop Time 1214    PT Time Calculation (min) 42 min             Past Medical History:  Diagnosis Date   Chlamydia     Past Surgical History:  Procedure Laterality Date   ANTERIOR CRUCIATE LIGAMENT REPAIR Right 09/17/2020   Procedure: ANTERIOR CRUCIATE LIGAMENT RECONSTRUCTION;  Surgeon: Leandrew Koyanagi, MD;  Location: [place];  Service: Orthopedics;  Laterality: Right;   KNEE ARTHROSCOPY WITH MENISCAL REPAIR Right 09/17/2020   Procedure: RIGHT KNEE MEDIAL MENISCAL REPAIR; PARTIAL LATERAL MENISCECTOMY;  Surgeon: Leandrew Koyanagi, MD;  Location: [place];  Service: Orthopedics;  Laterality: Right;   WISDOM TOOTH EXTRACTION      There were no vitals filed for this visit.   Subjective Assessment - 11/20/20 1136     Subjective Pt reports that things are going well.  He has been walking without his brace. 0/10 pain today.    Pertinent History Significant PMH: none              [facility] Adult PT Treatment/Exercise:   Therapeutic Exercise: - bike - 3 min at L5 - Supine heel slide with towel - 20x (HEP) - Prone knee flexion with strap - 2x30'' (HEP) - Lateral lunges - 2x10 - step up with march - 2x10  - lat step up - 2x10 - retro lunge with TRX - 2x10  - S/L heel raise with TRX - 2x10 - squat < 60 degrees in PF with TRX 3x10 - LAQ - 10# - 90-45 degrees - 3x10 (add in 5'' holds next visit) - wall squat to ~60 degrees - 2x10   Neuromuscular re-ed, to improve balance and reduce fall risk: - SLS on foam - 30'' bouts (improved)  Patient Education: - HEP was updated and reissued to patient; pt educated on HEP, was provided handout, and verbally confirmed understanding of exercises.      PT Short Term Goals - 11/06/20 1206       PT SHORT TERM GOAL #1   Title Ronald Rogers will be >75% HEP compliant to improve carryover between sessions and facilitate independent management of condition    Status Achieved    Target Date 10/30/20      PT SHORT TERM GOAL #2   Title Ronald Rogers will be compliant with post op precautions throughout therapy.    Status Achieved    Target Date 10/30/20  PT Long Term Goals - 10/02/20 1248       PT LONG TERM GOAL #1   Title Ronald Rogers will improve the following MMTs to >/= 4+/5 to show improvement in strength:  R knee ext and flexion  EVAL: unable to test  target date: 12/25/20    Baseline unable to test d/t post op precautions    Target Date 12/25/20      PT LONG TERM GOAL #2   Title Ronald Rogers will demonstrate R LE hop test within 90% of L LE  EVAL: unable to test  target date: 12/25/20    Baseline unable to test d/t post op precuations    Target Date 12/25/20      PT LONG TERM GOAL #3   Title Ronald Rogers will be able to stand for >1' in SLS stance on R LE on uneven surface, to show a significant improvement in balance in order to reduce fall risk  EVAL: unable   target date: 12/25/20    Baseline unable to test d/t post op precuations    Target Date 12/25/20      PT LONG TERM GOAL #4   Title Ronald Rogers will achieve symmetrical knee ROM  EVAL: R knee 0-75, L knee 10-0-130 degrees  target date: 12/25/20    Baseline R knee 0-75, L knee 10-0-130 degrees    Target Date 12/25/20                   Plan - 11/20/20 1207     Clinical Impression Statement Overall, Ronald Rogers is progressing well with therapy.  Pt reports no increase in baseline pain following therapy.  Today we concentrated on knee strengthening and hip strengthening.  Pt continues to make improvements in strength, gait, and balance.  We will continue to progress as appropriate.  Pt will continue to benefit from skilled physical therapy to address remaining deficits and achieve listed goals.  Continue per POC.    PT Treatment/Interventions ADLs/Self Care Home Management;Aquatic Therapy;Electrical Stimulation;Iontophoresis 39m/ml Dexamethasone;Gait training;Therapeutic activities;Therapeutic exercise;Neuromuscular re-education;Manual techniques;Dry needling;Vasopneumatic Device;Joint Manipulations    PT Next Visit Plan work to regain = knee ext, work on SLR, reinforce precautions, follow up on HEP, check HS length, can make FOTO goal -selfpay (recheck FOTO next visit).    PT Home Exercise Plan [email or web address]             Patient will benefit from skilled therapeutic intervention in order to improve the following deficits and impairments:  Abnormal gait, Decreased balance, Decreased endurance, Difficulty walking, Pain, Decreased strength  Visit Diagnosis: Right knee pain, unspecified chronicity  Muscle weakness  Other abnormalities of gait and mobility  Balance problem     Problem List Patient Active Problem List   Diagnosis Date Noted   Complete tear of right ACL, initial encounter 09/17/2020   Acute medial meniscus tear, right, initial encounter 09/17/2020   Acute tear  lateral meniscus, left, initial encounter 09/17/2020    Ronald Rogers PT 11/20/2020, 12:08 PM  PHYSICAL THERAPY DISCHARGE SUMMARY  Visits from Start of Care: 11  Current functional level related to goals / functional outcomes: See above, pt has not returned since the last PT session   Remaining deficits: See above, pt has not returned since the last PT session   Education / Equipment: HEP   Patient agrees to discharge. Patient goals were partially met. Patient is being discharged due to not returning since the last visit.  Ronald Ralls  MS, PT 01/07/21 8:30 AM   [place], [place], [address] Phone: [phone]   Fax:  [phone]  Name: Ronald Rogers MRN: [id number] Date of Birth: 1990/04/27

## 2020-11-20 NOTE — Patient Instructions (Signed)
Access Code: [email or web address] URL: [email or web address] Date: 11/20/2020 Prepared by: Alphonzo Severance  Exercises Sidelying Hip Abduction - 1 x daily - 7 x weekly - 2 sets - 10 reps - 3 hold Step Up - 1 x daily - 7 x weekly - 3 sets - 10 reps Lateral Step Up - 1 x daily - 7 x weekly - 3 sets - 10 reps Single Leg Stance - 2 x daily - 6 x weekly - 1 sets - 3 reps - 30 hold Lateral Lunge - 1 x daily - 7 x weekly - 3 sets - 10 reps

## 2020-11-26 ENCOUNTER — Telehealth: Payer: Self-pay

## 2020-11-26 ENCOUNTER — Ambulatory Visit: Payer: Self-pay

## 2020-11-26 NOTE — Telephone Encounter (Signed)
No show visit. Phone was busy. Left message in My chart.

## 2020-12-01 ENCOUNTER — Ambulatory Visit: Payer: Self-pay | Admitting: Physical Therapy

## 2020-12-04 ENCOUNTER — Ambulatory Visit: Payer: Self-pay | Admitting: Physical Therapy

## 2020-12-08 ENCOUNTER — Ambulatory Visit: Payer: Self-pay | Attending: Physician Assistant | Admitting: Physical Therapy

## 2020-12-08 ENCOUNTER — Telehealth: Payer: Self-pay | Admitting: Physical Therapy

## 2020-12-08 NOTE — Telephone Encounter (Signed)
Spoke to patient regarding no-show and attendance policy at clinic. He understands that if he has one more no-show or cancellation < 24 hours prior, he will only be allowed to make one appointment at a time.

## 2020-12-10 ENCOUNTER — Ambulatory Visit: Payer: Self-pay | Admitting: Physical Therapy

## 2020-12-16 ENCOUNTER — Ambulatory Visit: Payer: Self-pay | Admitting: Physical Therapy

## 2020-12-18 ENCOUNTER — Ambulatory Visit: Payer: Self-pay | Admitting: Physical Therapy

## 2020-12-23 ENCOUNTER — Ambulatory Visit: Payer: Self-pay | Admitting: Physical Therapy

## 2020-12-25 ENCOUNTER — Ambulatory Visit: Payer: Self-pay | Admitting: Physical Therapy

## 2021-11-27 ENCOUNTER — Ambulatory Visit
Admission: EM | Admit: 2021-11-27 | Discharge: 2021-11-27 | Disposition: A | Discharge status: Home / Self Care | Payer: Medicaid Other | Attending: Urgent Care | Admitting: Urgent Care

## 2021-11-27 DIAGNOSIS — N342 Other urethritis: Secondary | ICD-10-CM | POA: Insufficient documentation

## 2021-11-27 DIAGNOSIS — R369 Urethral discharge, unspecified: Secondary | ICD-10-CM | POA: Insufficient documentation

## 2021-11-27 MED ORDER — DOXYCYCLINE HYCLATE 100 MG PO CAPS: 100.0000 mg | ORAL_CAPSULE | Freq: Two times a day (BID) | ORAL | 0 refills | Status: DC

## 2021-11-27 MED ORDER — CEFTRIAXONE SODIUM 500 MG IJ SOLR
500.0000 mg | INTRAMUSCULAR | Status: DC
  Administered 2021-11-27: 500 mg via INTRAMUSCULAR

## 2021-11-27 NOTE — Discharge Instructions (Signed)
Avoid all forms of sexual intercourse (oral, vaginal, anal) for the next 7 days to avoid spreading/reinfecting or at least until we can see what kinds of infection results are positive.  Abstaining for 2 weeks would be better but at least 1 week is required.  We will let you know about your test results from the swab we did today and if you need any prescriptions for antibiotics or changes to your treatment from today.  

## 2021-11-27 NOTE — ED Provider Notes (Signed)
[facility]  Note:  This document was prepared using Conservation officer, historic buildings and may include unintentional dictation errors.  MRN: [id number] DOB: 1990/12/24  Subjective:   Ronald Rogers is a 31 y.o. male presenting for 3-day history of acute onset penile discharge, dysuria.  Patient recently had unprotected sex about 3 days ago.  In general, he does try to be careful.  He previously had a sexually transmitted infection but states it was about 10 years ago.  Denies hematuria, urinary frequency, penile swelling, testicular pain, testicular swelling, anal pain, groin pain.   No current facility-administered medications for this encounter.  Current Outpatient Medications:    lidocaine (LIDODERM) 5 %, Place 1 patch onto the skin daily. Remove & Discard patch within 12 hours or as directed by MD, Disp: 30 patch, Rfl: 0   methocarbamol (ROBAXIN) 500 MG tablet, Take 1 tablet (500 mg total) by mouth 2 (two) times daily., Disp: 20 tablet, Rfl: 0   methocarbamol (ROBAXIN) 750 MG tablet, Take 1 tablet (750 mg total) by mouth 2 (two) times daily as needed for muscle spasms., Disp: 20 tablet, Rfl: 3   naproxen (NAPROSYN) 500 MG tablet, Take 1 tablet (500 mg total) by mouth 2 (two) times daily., Disp: 30 tablet, Rfl: 0   oxyCODONE-acetaminophen (PERCOCET) 5-325 MG tablet, Take 1 tablet by mouth every 8 (eight) hours as needed for severe pain., Disp: 30 tablet, Rfl: 0   promethazine (PHENERGAN) 25 MG tablet, Take 1 tablet (25 mg total) by mouth every 6 (six) hours as needed for nausea., Disp: 30 tablet, Rfl: 1   No Known Allergies  Past Medical History:  Diagnosis Date   Chlamydia      Past Surgical History:  Procedure Laterality Date   ANTERIOR CRUCIATE LIGAMENT REPAIR Right 09/17/2020   Procedure: ANTERIOR CRUCIATE LIGAMENT RECONSTRUCTION;  Surgeon: Tarry Kos, MD;  Location: [facility];  Service: Orthopedics;  Laterality: Right;   KNEE  ARTHROSCOPY WITH MENISCAL REPAIR Right 09/17/2020   Procedure: RIGHT KNEE MEDIAL MENISCAL REPAIR; PARTIAL LATERAL MENISCECTOMY;  Surgeon: Tarry Kos, MD;  Location: [facility];  Service: Orthopedics;  Laterality: Right;   WISDOM TOOTH EXTRACTION      History reviewed. No pertinent family history.  Social History   Tobacco Use   Smoking status: Never   Smokeless tobacco: Never  Substance Use Topics   Alcohol use: Not Currently   Drug use: No    ROS   Objective:   Vitals: BP 130/86 (BP Location: Left Arm)   Pulse 78   Temp 98.5 F (36.9 C) (Oral)   Resp 18   SpO2 98%   Physical Exam Constitutional:      General: He is not in acute distress.    Appearance: Normal appearance. He is well-developed and normal weight. He is not ill-appearing, toxic-appearing or diaphoretic.  HENT:     Head: Normocephalic and atraumatic.     Right Ear: External ear normal.     Left Ear: External ear normal.     Nose: Nose normal.     Mouth/Throat:     Pharynx: Oropharynx is clear.  Eyes:     General: No scleral icterus.       Right eye: No discharge.        Left eye: No discharge.     Extraocular Movements: Extraocular movements intact.  Cardiovascular:     Rate and Rhythm: Normal rate.  Pulmonary:  Effort: Pulmonary effort is normal.  Genitourinary:    Penis: Circumcised. Discharge present. No phimosis, paraphimosis, hypospadias, erythema, tenderness, swelling or lesions.   Musculoskeletal:     Cervical back: Normal range of motion.  Neurological:     Mental Status: He is alert and oriented to person, place, and time.  Psychiatric:        Mood and Affect: Mood normal.        Behavior: Behavior normal.        Thought Content: Thought content normal.        Judgment: Judgment normal.     Assessment and Plan :   PDMP not reviewed this encounter.  1. Urethritis   2. Penile discharge     Patient treated empirically as per CDC guidelines with IM  ceftriaxone, doxycycline as an outpatient.  Labs pending.   Counseled on safe sex practices including abstaining for 1 week following treatment.  Counseled patient on potential for adverse effects with medications prescribed/recommended today, ER and return-to-clinic precautions discussed, patient verbalized understanding.    Wallis Bamberg, PA-C 11/27/21 1751

## 2021-11-27 NOTE — ED Triage Notes (Signed)
Pt c/o penile d/c and dysuria x 2-3 days-denies flu like sx stated on chart-NAD-steady gait

## 2021-11-28 ENCOUNTER — Telehealth: Payer: Self-pay | Admitting: Urgent Care

## 2021-11-28 NOTE — Telephone Encounter (Signed)
Needs a cytology order. Placed as a telephone encounter.

## 2021-11-30 LAB — CYTOLOGY, (ORAL, ANAL, URETHRAL) ANCILLARY ONLY
Chlamydia: NEGATIVE
Comment: NEGATIVE
Comment: NEGATIVE
Comment: NORMAL
Neisseria Gonorrhea: POSITIVE — AB
Trichomonas: NEGATIVE

## 2022-01-14 IMAGING — CR DG KNEE COMPLETE 4+V*R*
4 series · 4 of 4 positions shown · non-contrast
Comparison: None.

CLINICAL DATA: Knee pain and swelling following playing basketball,
initial encounter

EXAM:
RIGHT KNEE - COMPLETE 4+ VIEW

[knee ap]
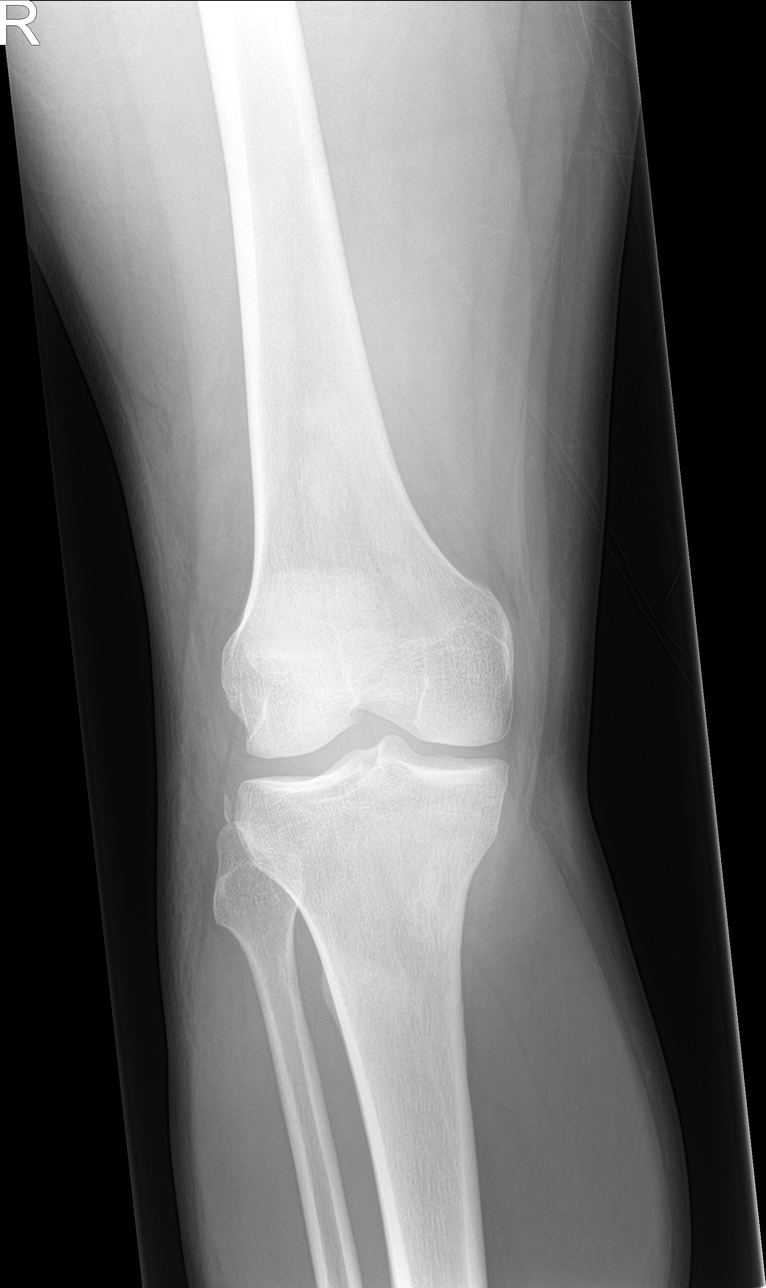

[knee lat]
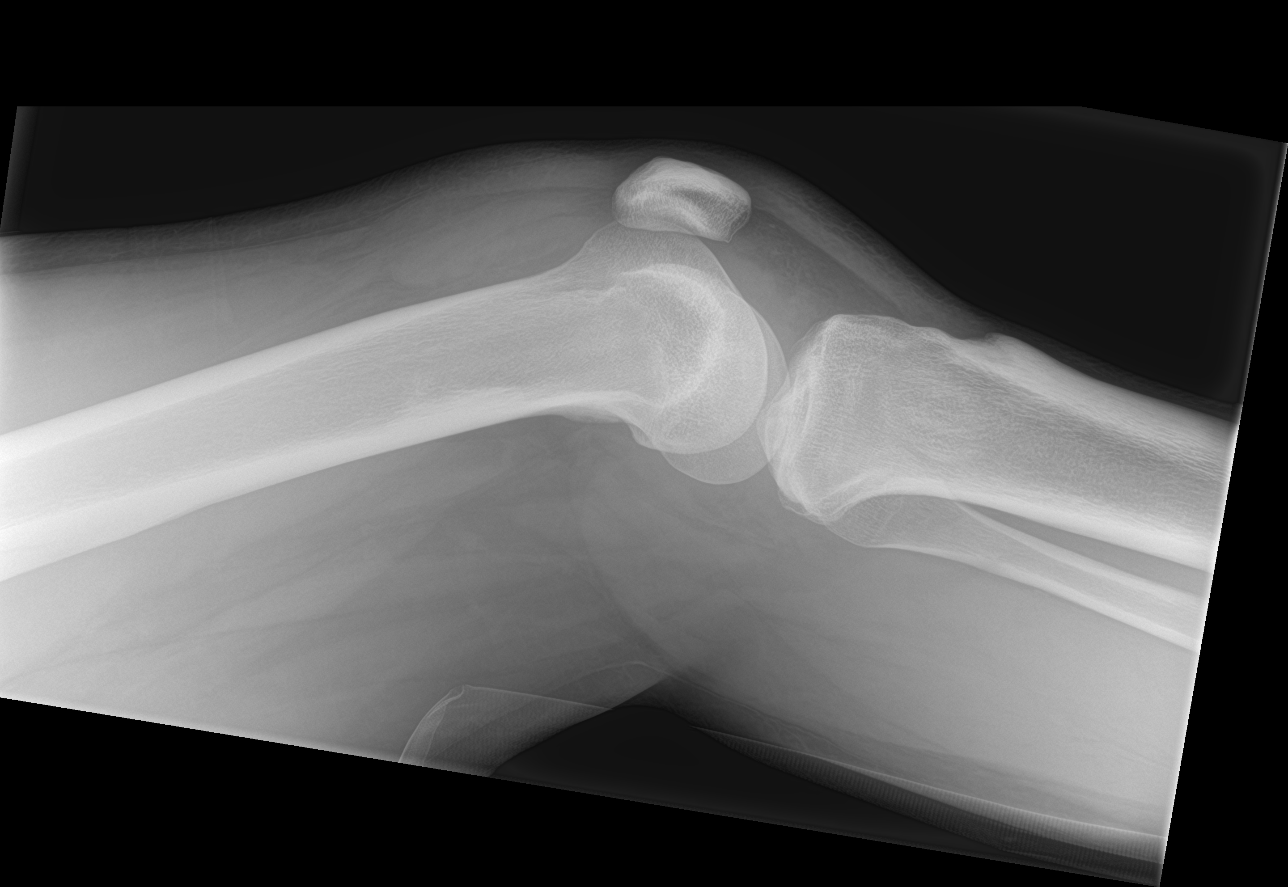

[knee obl (1 of 2)]
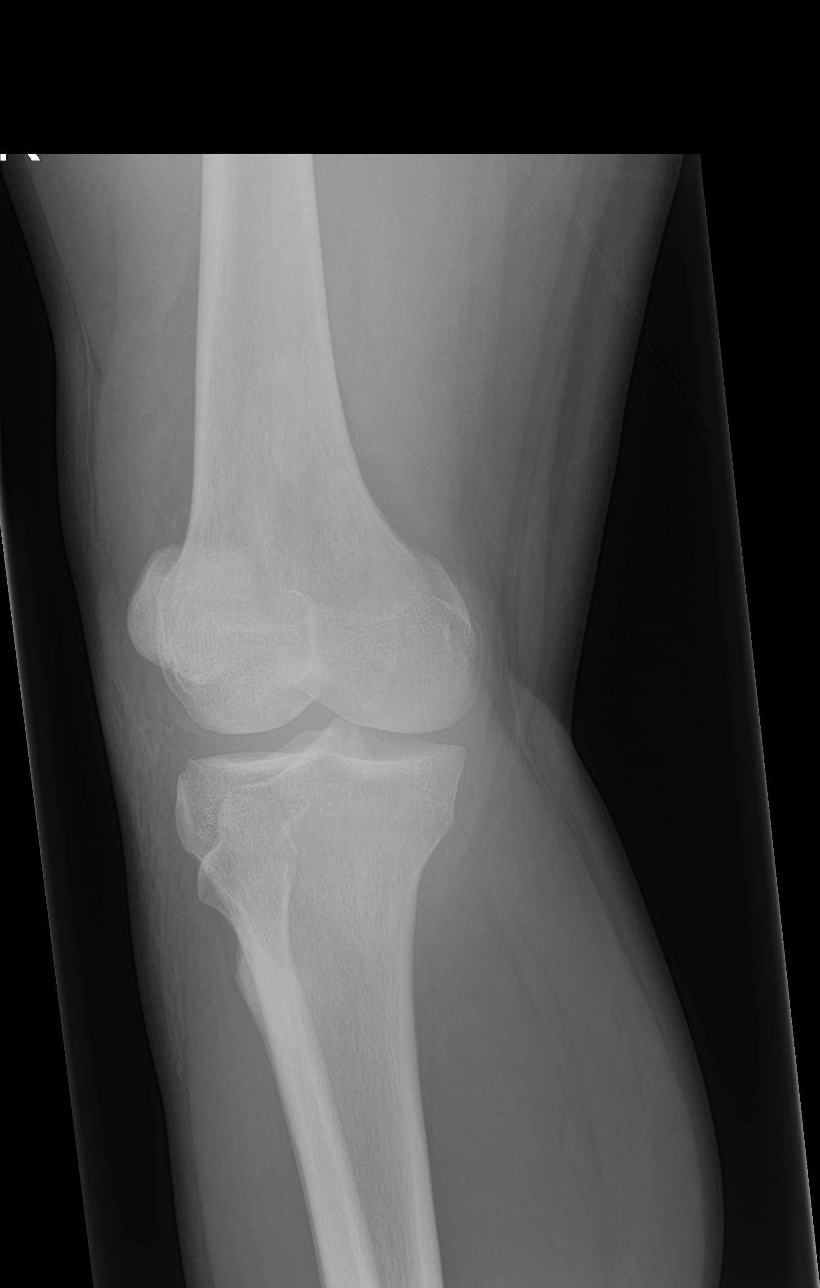

[knee obl (2 of 2)]
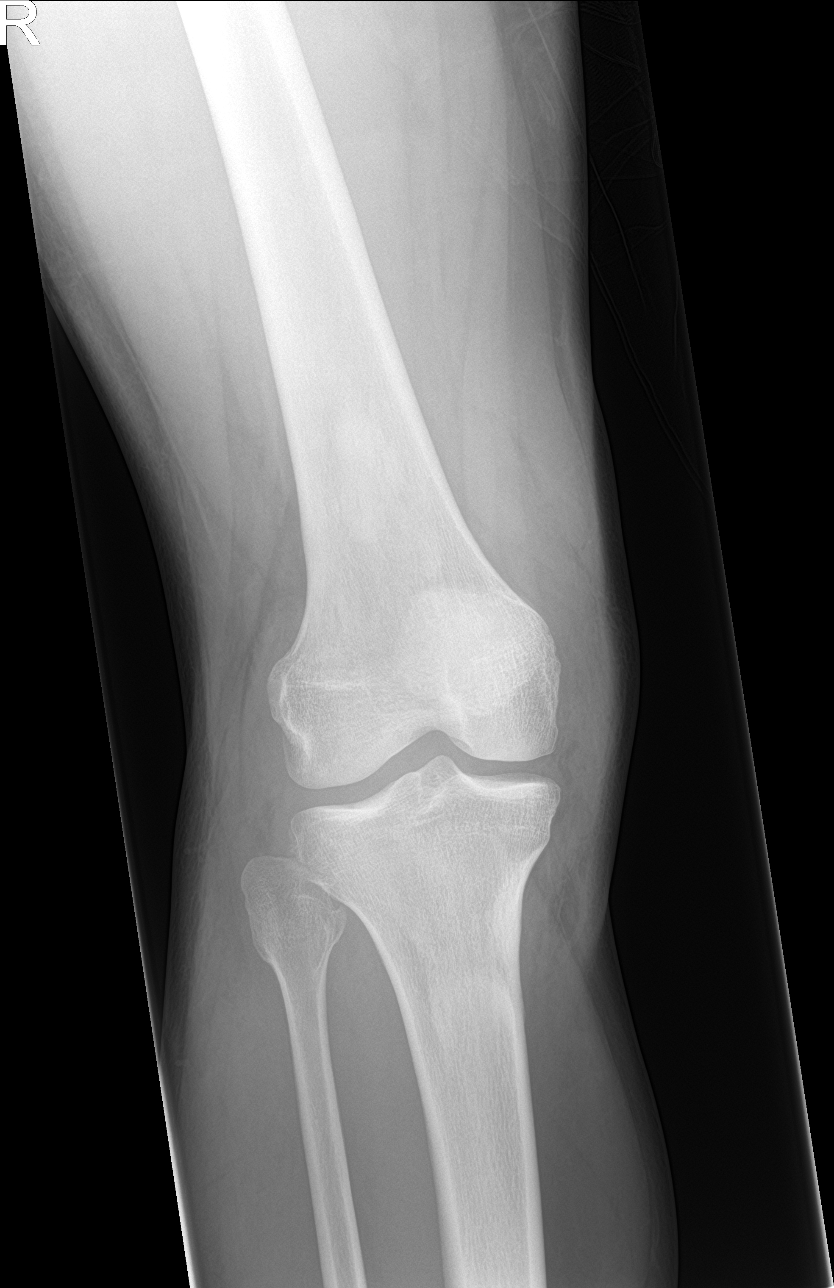

[4 of 4 positions shown; findings below may reference images not displayed]

FINDINGS: Small joint effusion is noted. There is an avulsion fracture from
the lateral aspect of the proximal tibia. No other fractures are
seen.
IMPRESSION: Avulsion from the lateral aspect of the proximal tibia. Associated
joint effusion is seen. No other focal abnormality is noted.

## 2023-12-11 ENCOUNTER — Emergency Department (HOSPITAL_COMMUNITY)
Admission: EM | Admit: 2023-12-11 | Discharge: 2023-12-12 | Disposition: A | Attending: Emergency Medicine | Admitting: Emergency Medicine

## 2023-12-11 ENCOUNTER — Encounter (HOSPITAL_COMMUNITY): Payer: Self-pay | Admitting: *Deleted

## 2023-12-11 ENCOUNTER — Other Ambulatory Visit: Payer: Self-pay

## 2023-12-11 DIAGNOSIS — S060X1A Concussion with loss of consciousness of 30 minutes or less, initial encounter: Secondary | ICD-10-CM

## 2023-12-11 DIAGNOSIS — S161XXA Strain of muscle, fascia and tendon at neck level, initial encounter: Secondary | ICD-10-CM
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
## 2023-12-12 ENCOUNTER — Emergency Department (HOSPITAL_COMMUNITY)

## 2023-12-12 MED ORDER — ACETAMINOPHEN 325 MG PO TABS
650.0000 mg | ORAL_TABLET | Freq: Once | ORAL | Status: AC
  Administered 2023-12-12: 650 mg via ORAL
  Filled 2023-12-12: qty 2

## 2023-12-12 NOTE — ED Notes (Signed)
 Pt able to walk to the restroom with no assistance.

## 2023-12-12 NOTE — ED Notes (Addendum)
 Patient transported to CT
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
